# Patient Record
Sex: Male | Born: 1985 | Race: Black or African American | Hispanic: No | Marital: Single | Smoking: Never smoker
Health system: Southern US, Community
[De-identification: ages and names within clinical notes are randomized; demographics above are authoritative.]

## PROBLEM LIST (undated history)

## (undated) DIAGNOSIS — E669 Obesity, unspecified: Secondary | ICD-10-CM

## (undated) HISTORY — DX: Obesity, unspecified: E66.9

---
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
## 2014-10-06 ENCOUNTER — Emergency Department (HOSPITAL_COMMUNITY): Payer: No Typology Code available for payment source

## 2014-10-06 ENCOUNTER — Encounter (HOSPITAL_COMMUNITY): Payer: Self-pay | Admitting: *Deleted

## 2014-10-06 ENCOUNTER — Emergency Department (HOSPITAL_COMMUNITY)
Admission: EM | Admit: 2014-10-06 | Discharge: 2014-10-06 | Disposition: A | Discharge status: Home / Self Care | Payer: No Typology Code available for payment source | Attending: Emergency Medicine | Admitting: Emergency Medicine

## 2014-10-06 DIAGNOSIS — S39012A Strain of muscle, fascia and tendon of lower back, initial encounter: Secondary | ICD-10-CM | POA: Diagnosis not present

## 2014-10-06 DIAGNOSIS — Y92410 Unspecified street and highway as the place of occurrence of the external cause: Secondary | ICD-10-CM | POA: Insufficient documentation

## 2014-10-06 DIAGNOSIS — Y9389 Activity, other specified: Secondary | ICD-10-CM | POA: Insufficient documentation

## 2014-10-06 DIAGNOSIS — S161XXA Strain of muscle, fascia and tendon at neck level, initial encounter: Secondary | ICD-10-CM | POA: Diagnosis not present

## 2014-10-06 DIAGNOSIS — S3992XA Unspecified injury of lower back, initial encounter: Secondary | ICD-10-CM | POA: Diagnosis present

## 2014-10-06 DIAGNOSIS — Y998 Other external cause status: Secondary | ICD-10-CM | POA: Insufficient documentation

## 2014-10-06 MED ORDER — NAPROXEN 500 MG PO TABS: 500.0000 mg | ORAL_TABLET | Freq: Two times a day (BID) | ORAL | Status: DC

## 2014-10-06 MED ORDER — HYDROCODONE-ACETAMINOPHEN 5-325 MG PO TABS: 2.0000 | ORAL_TABLET | ORAL | Status: DC | PRN

## 2014-10-06 MED ORDER — TRAMADOL HCL 50 MG PO TABS
50.0000 mg | ORAL_TABLET | Freq: Once | ORAL | Status: AC
  Administered 2014-10-06: 50 mg via ORAL
  Filled 2014-10-06: qty 1

## 2014-10-06 MED ORDER — METHOCARBAMOL 500 MG PO TABS: 500.0000 mg | ORAL_TABLET | Freq: Two times a day (BID) | ORAL | Status: DC | PRN

## 2014-10-06 NOTE — ED Notes (Addendum)
Pt ambulated to restroom with no difficulty. Given crackers and drink.
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
## 2014-10-06 NOTE — Discharge Instructions (Signed)
Please call your doctor for a followup appointment within 24-48 hours. When you talk to your doctor please let them know that you were seen in the emergency department and have them acquire all of your records so that they can discuss the findings with you and formulate a treatment plan to fully care for your new and ongoing problems. ° °RESOURCE GUIDE ° °Chronic Pain Problems: °Contact [facility]  [phone] °Patients need to be referred by their primary care doctor. ° °Insufficient Money for Medicine: °Contact [employer] Way:  call "211."  ° °No Primary Care Doctor: °- Call [facility]  [phone] - can help you locate a primary care doctor that  accepts your insurance, provides certain services, etc. °- Physician Referral Service- [phone] ° °Agencies that provide inexpensive medical care: °- [facility]  [phone] °- [facility]  [phone] °- [facility]  [phone] °- [facility]  [phone] °- [facility]  [phone] °- [facility]  [phone] ° °Medicaid-accepting [place] Providers: °- [facility]- [address] ° [phone], Mon-Fri 9am-7pm, Sat 9am-1pm °- [facility]- [address], [address] ° [phone] °- [facility]- [address], [address] ° [phone] °- [facility]- [address] [place] [address] ° [phone] °- Ve[facility]- [address], [address], [phone] ° Only accepts [place] Access Medicaid patients after they have their name  applied to their card ° °Self Pay (no insurance) in [place]: °- Sickle Cell Patients: Dr Eric Dean, [facility] ° [address], [phone] °- [facility]- [address] ° [phone] °      -      [facility]- [address] °      -     [facility]- see information above (Speak to Pam H if you do not have  insurance) °      -  [facility] [place]- [address],  [phone] °      -  [facility]- [address] [place] [address], [phone] °      -  Dr Osei-Bonsu-  [address], [address], [place], [phone] °      -  [facility] - [address], [phone] °      -  [facility]- [address] [place] [address], [phone], also [address], [phone] °      -    [facility]- [address], [phone], 1st & 3rd Saturday °       every month, 10am-1pm ° °[facility] °[address] °[place], [place] [address] °[phone] ° °[facility] °[address] °[place], [place] [address] °[phone] ° °1) Find a Doctor and Pay Out of Pocket °Although you won't have to find out who is covered by your insurance plan, it is a good idea to ask around and get recommendations. You will then need to call the office and see if the doctor you have chosen will accept you as a new patient and what types of options they offer for patients who are self-pay. Some doctors offer discounts or will set up payment plans for their patients who do not have insurance, but you will need to ask so you aren't surprised when you   get to your appointment.  2) Contact Your Local Health Department Not all health departments have doctors that can see patients for sick visits, but many do, so it is worth a call to see if yours does. If you don't know where your local health department is, you can check in your phone book. The CDC also has a tool to help you locate your state's health department, and many state websites also have listings of all of their local health departments.  3) Find a Walk-in Clinic If your illness is not likely to be very severe or complicated, you may want to try a walk in clinic. These are popping up all over the country in pharmacies, drugstores, and shopping centers. They're usually staffed by nurse practitioners or physician  assistants that have been trained to treat common illnesses and complaints. They're usually fairly quick and inexpensive. However, if you have serious medical issues or chronic medical problems, these are probably not your best option  STD Testing - [facility], [facility] Clinic, [address], [place], phone [phone] or [phone].  Monday - Friday, call for an appointment. [facility]- [place] [facility] [place], STD Clinic, [place], [place], phone [phone] or [phone].  Monday - Friday, call for an appointment.  Abuse/Neglect: [facility]- [place] Child Abuse Hotline 3080498219([phone]) [phone] [facility]- [place] Child Abuse Hotline [phone] (After Hours)  Emergency Shelter:  [facility] [phone]) [phone]  Maternity Homes: - [facility] [phone]) [phone] - [facility] [phone]  MRSA Hotline #:   714-510-0804236-083-2762  Dental Assistance If unable to pay or uninsured, contact:  [facility] to become qualified for the adult dental clinic.  Patients with Medicaid: [facility] [phone], [phone] [address], [phone]  If unable to pay, or uninsured, contact [facility] [phone] in [place], [phone] in [facility]) to become qualified for the adult dental clinic  [facility] [address] [place], [place] [phone] [email or web address]  Other [facility] Services: - [facility]- [address], [place], [place], [phone], [phone], [phone], 2nd and 4th Thursday of the month at 6:30am.  10 clients each day by appointment, can sometimes see walk-in patients if someone does not show for an appointment. [facility]- [address], [place], [place], [address], [phone] - [facility]- [address], [place], [place],  [phone], [phone] [facility]- [place] [facility]- [phone] [facility]- [facility]- [phone] [facility]- [phone] - Thought he listed in the right
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
## 2017-05-17 ENCOUNTER — Emergency Department (HOSPITAL_COMMUNITY): Payer: BLUE CROSS/BLUE SHIELD

## 2017-05-17 ENCOUNTER — Ambulatory Visit (INDEPENDENT_AMBULATORY_CARE_PROVIDER_SITE_OTHER): Payer: BLUE CROSS/BLUE SHIELD | Admitting: Medical

## 2017-05-17 ENCOUNTER — Encounter: Payer: Self-pay | Admitting: Medical

## 2017-05-17 ENCOUNTER — Emergency Department (HOSPITAL_COMMUNITY)
Admission: EM | Admit: 2017-05-17 | Discharge: 2017-05-18 | Disposition: A | Discharge status: Home / Self Care | Payer: BLUE CROSS/BLUE SHIELD | Attending: Emergency Medicine | Admitting: Emergency Medicine

## 2017-05-17 ENCOUNTER — Encounter (HOSPITAL_COMMUNITY): Payer: Self-pay | Admitting: Emergency Medicine

## 2017-05-17 VITALS — BP 128/70 | HR 118 | Temp 100.1°F | Ht 69.0 in | Wt 259.4 lb

## 2017-05-17 DIAGNOSIS — K610 Anal abscess: Secondary | ICD-10-CM | POA: Diagnosis not present

## 2017-05-17 DIAGNOSIS — K6289 Other specified diseases of anus and rectum: Secondary | ICD-10-CM | POA: Diagnosis not present

## 2017-05-17 DIAGNOSIS — R509 Fever, unspecified: Secondary | ICD-10-CM

## 2017-05-17 DIAGNOSIS — R52 Pain, unspecified: Secondary | ICD-10-CM

## 2017-05-17 DIAGNOSIS — L0291 Cutaneous abscess, unspecified: Secondary | ICD-10-CM

## 2017-05-17 LAB — CBC WITH DIFFERENTIAL/PLATELET
Band Neutrophils: 0 %
Basophils Absolute: 0 10*3/uL (ref 0.0–0.1)
Basophils Relative: 0 %
Blasts: 0 %
Eosinophils Absolute: 0 10*3/uL (ref 0.0–0.7)
Eosinophils Relative: 0 %
HCT: 47.7 % (ref 39.0–52.0)
Hemoglobin: 16.2 g/dL (ref 13.0–17.0)
Lymphocytes Relative: 15 %
Lymphs Abs: 2.3 10*3/uL (ref 0.7–4.0)
MCH: 28.5 pg (ref 26.0–34.0)
MCHC: 34 g/dL (ref 30.0–36.0)
MCV: 83.8 fL (ref 78.0–100.0)
Metamyelocytes Relative: 0 %
Monocytes Absolute: 1.2 10*3/uL — ABNORMAL HIGH (ref 0.1–1.0)
Monocytes Relative: 8 %
Myelocytes: 0 %
Neutro Abs: 12 10*3/uL — ABNORMAL HIGH (ref 1.7–7.7)
Neutrophils Relative %: 77 %
Other: 0 %
Platelets: 271 10*3/uL (ref 150–400)
Promyelocytes Absolute: 0 %
RBC: 5.69 MIL/uL (ref 4.22–5.81)
RDW: 12.8 % (ref 11.5–15.5)
WBC: 15.5 10*3/uL — ABNORMAL HIGH (ref 4.0–10.5)
nRBC: 0 /100 WBC

## 2017-05-17 LAB — I-STAT CG4 LACTIC ACID, ED
Lactic Acid, Venous: 1.27 mmol/L (ref 0.5–1.9)
Lactic Acid, Venous: 2.02 mmol/L (ref 0.5–1.9)

## 2017-05-17 LAB — COMPREHENSIVE METABOLIC PANEL
ALT: 13 U/L — ABNORMAL LOW (ref 17–63)
AST: 17 U/L (ref 15–41)
Albumin: 4.2 g/dL (ref 3.5–5.0)
Alkaline Phosphatase: 77 U/L (ref 38–126)
Anion gap: 11 (ref 5–15)
BUN: 12 mg/dL (ref 6–20)
CO2: 27 mmol/L (ref 22–32)
Calcium: 9.6 mg/dL (ref 8.9–10.3)
Chloride: 101 mmol/L (ref 101–111)
Creatinine, Ser: 1.4 mg/dL — ABNORMAL HIGH (ref 0.61–1.24)
GFR calc Af Amer: >60 mL/min (ref >60)
GFR calc non Af Amer: >60 mL/min (ref >60)
Glucose, Bld: 116 mg/dL — ABNORMAL HIGH (ref 65–99)
Potassium: 4.3 mmol/L (ref 3.5–5.1)
Sodium: 139 mmol/L (ref 135–145)
Total Bilirubin: 0.9 mg/dL (ref 0.3–1.2)
Total Protein: 8.6 g/dL — ABNORMAL HIGH (ref 6.5–8.1)

## 2017-05-17 LAB — PROTIME-INR
INR: 1.1
Prothrombin Time: 14.3 seconds (ref 11.4–15.2)

## 2017-05-17 MED ORDER — SODIUM CHLORIDE 0.9 % IV BOLUS (SEPSIS)
1000.0000 mL | Freq: Once | INTRAVENOUS | Status: AC
  Administered 2017-05-17: 1000 mL via INTRAVENOUS

## 2017-05-17 MED ORDER — CLINDAMYCIN HCL 150 MG PO CAPS: 450.0000 mg | ORAL_CAPSULE | Freq: Three times a day (TID) | ORAL | 0 refills | Status: AC

## 2017-05-17 MED ORDER — CLINDAMYCIN HCL 300 MG PO CAPS
450.0000 mg | ORAL_CAPSULE | Freq: Once | ORAL | Status: AC
  Administered 2017-05-18: 450 mg via ORAL
  Filled 2017-05-17: qty 1

## 2017-05-17 MED ORDER — OXYCODONE-ACETAMINOPHEN 5-325 MG PO TABS: 1.0000 | ORAL_TABLET | ORAL | 0 refills | Status: DC | PRN

## 2017-05-17 MED ORDER — IOPAMIDOL (ISOVUE-300) INJECTION 61%
100.0000 mL | Freq: Once | INTRAVENOUS | Status: AC | PRN
  Administered 2017-05-17: 100 mL via INTRAVENOUS

## 2017-05-17 MED ORDER — IOPAMIDOL (ISOVUE-300) INJECTION 61%
INTRAVENOUS | Status: AC
  Filled 2017-05-17: qty 100

## 2017-05-17 NOTE — Progress Notes (Signed)
Subjective: Chief Complaint  Patient presents with  . New Patient (Initial Visit)    hemmroids fever and backaches.started last thursday    Here as a new patient.  Was seeing PCP on [employer] prior.    Last week later in the week started feeling pain around anus.  Started out as a light discomfort, but daily has gotten progressively worse.  Feels like there is a bump to the side of the anus.   Pain today is 9/10.  Having trouble just standing and sitting today.  Thinks there has been some bloody drainage.  Felt feverish over the weekend.  Has been drinking a lot of smoothies, eating more fiber to help.    Prior to last week doesn't get bowel issues.   No prior hemorrhoids, no prior blood in stool.  Rarely gets constipated.  Prior to last week was in normal state of health.  Single, no prior sexual activity ever.   No recent sick contacts.  No urinary issues.  No hx/o HIV, diabetes or immunocompromised state.    No other aggravating or relieving factors. No other complaint.  No past medical history on file. No current outpatient prescriptions on file prior to visit.   No current facility-administered medications on file prior to visit.    Review of Systems Constitutional: +fever, -chills, -sweats, -unexpected weight change,-fatigue ENT: -runny nose, -ear pain, -sore throat Cardiology:  -chest pain, -palpitations, -edema Respiratory: -cough, -shortness of breath, -wheezing Gastroenterology: -abdominal pain, -nausea, -vomiting, -diarrhea, -constipation , +fecal incontinence this morning Hematology: -bleeding or bruising problems Musculoskeletal: -arthralgias, -myalgias, -joint swelling, +back pain Ophthalmology: -vision changes Urology: -dysuria, -difficulty urinating, -hematuria, -urinary frequency, -urgency Neurology: -headache, -weakness, -tingling, -numbness     Objective: BP 128/70   Pulse (!) 118   Temp 100.1 F (37.8 C)   Ht 5\' 9"  (1.753 m)   Wt 259 lb 6.4 oz (117.7 kg)    SpO2 96%   BMI 38.31 kg/m   Gen: wd, wn, in some pain when walking, ill appearing Skin: warm, dry Lungs clear Heart rrr, normal s1, s2, no murmurs Rectal: right buttock just adjacent to anus with 4-5 cm diameter area of swelling, erythema, induration, quite tender, center opening with some moist pus drainage, anus appears normal.      Assessment: Encounter Diagnoses  Name Primary?  . Perianal abscess Yes  . Fever, unspecified fever cause      Plan: discussed findings on exam.  Given tachycardia, fever, exam findings, we called and general surgery can't work him in  Today.   discussed diagnosis and treatment recommendations with patient.  Advised he report to the emergency dept [facility] for further treatment and eval.    Andrew Valdez was seen today for new patient (initial visit).  Diagnoses and all orders for this visit:  Perianal abscess  Fever, unspecified fever cause

## 2017-05-17 NOTE — Discharge Instructions (Signed)
Please take your antibiotics to treat the abscess. These take sitz baths. Please use the pain medicines and with your symptoms. Please follow-up with your primary care physician for reevaluation and further management in the next few days. If any symptoms change or worsen, please return to the nearest emergency department.

## 2017-05-17 NOTE — ED Triage Notes (Signed)
[facility] perianal abscess. Per chart abscess is a surgical indication. Pt reports fever yet not febrile while triaging. Pain 9/10. Appears in distress.

## 2017-05-17 NOTE — Patient Instructions (Signed)
Perianal Abscess  An abscess is an infected area that is filled with pus. A perianal abscess occurs in the perineum, which is the area between the anus and the scrotum in males and between the anus and the vagina in females. Perianal abscesses can vary in size. Without treatment, a perianal abscess can become larger and cause other problems.  What are the causes?  This condition is caused by:  · Waste from damaged or dead tissue (debris) that plugs up glands in the perineum. When this happens, an abscess may form.  · Infections of the perineum.    What are the signs or symptoms?  Common symptoms of this condition include:  · Swelling and redness in the area of the abscess. The redness may go beyond the abscess and appear as a red streak on the skin.  · Pain in the area of the abscess, including pain when sitting, walking, or passing stool.    Other possible symptoms include:  · A visible, painful lump, or a lump that can be felt when touched.  · Bleeding or pus-like discharge from the area.  · Fever.  · General weakness.    How is this diagnosed?  This condition is diagnosed based on your medical history and a physical exam of the affected area.  · This may involve examining the rectal area with a gloved hand (digital rectal exam).  · Sometimes, the health care provider needs to look into the rectum using a probe or a scope.  · For women, it may require a careful vaginal exam.    How is this treated?  Treatment for this condition may include:  · Making a cut (incision) in the abscess to drain the pus. This can sometimes be done in your health care provider's office or an emergency department after you are given medicine to numb the area (local anesthetic).  · Surgery to drain the abscess. This is for larger or deeper abscesses.  · Antibiotic medicines, if there is infection in the surrounding tissue (cellulitis).  · Having gauze packed into the abscess to continue draining the area.  · Frequent baths in warm water  that is deep enough to cover your hips and buttocks (sitz baths). These help the wound heal and they make the abscess less likely to come back.    Follow these instructions at home:  Medicines  · Take over-the-counter and prescription medicines for pain, fever, or discomfort only as told by your health care provider.  · If you were prescribed an antibiotic medicine, use it as told by your health care provider. Do not stop using the antibiotic even if you start to feel better.  · Do not drive or use heavy machinery while taking prescription pain medicine.  Wound care    · Keep the skin around the wound clean and dry. Avoid cleaning the area too much.  · Avoid scratching the wound.  · Avoid using colored or perfumed toilet papers.  · Take a sitz bath 3-4 times a day and after bowel movements. This will help reduce pain and swelling.  · If directed, apply ice to the injured area:  ? Put ice in a plastic bag.  ? Place a towel between your skin and the bag.  ? Leave the ice on for 20 minutes, 2-3 times a day.  · Check your incision area every day for signs of infection. Check for:  ? More redness, swelling, or pain.  ? More fluid or blood.  ?   Warmth.  ? Pus or a bad smell.  Gauze  · If gauze was used in the abscess, follow instructions from your health care provider about removing or changing the gauze. It can usually be removed in 2-3 days.  · Wash your hands with soap and water before you remove or change your gauze. If soap and water are not available, use hand sanitizer.  · If one or more drains were placed in the abscess cavity, be careful not to pull at them. Your health care provider will tell you how long they need to remain in place.  General instructions  · Keep all follow-up visits as told by your health care provider. This is important.  Contact a health care provider if:  · You have trouble passing stool or passing urine.  · Your pain or swelling in the affected area does not seem to be getting  better.  · The gauze packing or the drains come out before the planned time.  Get help right away if:  · You have problems moving or using your legs.  · You have severe or increasing pain.  · Your swelling in the affected area suddenly gets worse.  · You have a large increase in bleeding or passing of pus.  · You have chills or a fever.  This information is not intended to replace advice given to you by your health care provider. Make sure you discuss any questions you have with your health care provider.  Document Released: 11/16/2006 Document Revised: 04/29/2016 Document Reviewed: 03/21/2016  Elsevier Interactive Patient Education © 2018 Elsevier Inc.

## 2017-05-17 NOTE — ED Notes (Signed)
Pt is alert and oriented x 4 and is verbally responsive. Pt is talking with the provider.

## 2017-05-17 NOTE — ED Notes (Signed)
Notified EDP,Tegler,MD., pt. I-stat CG4 Lactic acid results 2.02 and RN,Celeste.

## 2017-05-17 NOTE — ED Provider Notes (Signed)
[facility]-EMERGENCY DEPT Provider Note   CSN: [id number] Arrival date & time: 05/17/17  1533     History   Chief Complaint Chief Complaint  Patient presents with  . perianal abscess    HPI Andrew Valdez is a 31 y.o. male.  The history is provided by the patient and medical records.  Abscess  Location:  Pelvis Pelvic abscess location:  R buttock Size:  1 Abscess quality: draining, fluctuance, induration, painful, redness and warmth   Red streaking: no   Duration:  1 week Progression:  Unchanged Pain details:    Quality:  Aching   Severity:  Moderate   Timing:  Constant   Progression:  Unchanged Chronicity:  New Context: not diabetes and not skin injury   Relieved by:  Nothing Worsened by:  Nothing Ineffective treatments:  None tried Associated symptoms: no fatigue, no fever, no headaches, no nausea and no vomiting   Risk factors: no prior abscess     History reviewed. No pertinent past medical history.  There are no active problems to display for this patient.   History reviewed. No pertinent surgical history.     Home Medications    Prior to Admission medications   Medication Sig Start Date End Date Taking? Authorizing Provider  shark liver oil-cocoa butter (PREPARATION H) 0.25-3-85.5 % suppository Place 1 suppository rectally as needed for hemorrhoids.    [provider]    Family History No family history on file.  Social History Social History  Substance Use Topics  . Smoking status: Never Smoker  . Smokeless tobacco: Never Used  . Alcohol use No     Allergies   Patient has no known allergies.   Review of Systems Review of Systems  Constitutional: Negative for chills, diaphoresis, fatigue and fever.  HENT: Negative for congestion.   Respiratory: Negative for chest tightness, shortness of breath and wheezing.   Cardiovascular: Negative for chest pain and palpitations.  Gastrointestinal: Positive for diarrhea and rectal pain.  Negative for abdominal pain, blood in stool, constipation, nausea and vomiting.  Genitourinary: Negative for flank pain.  Musculoskeletal: Negative for back pain, neck pain and neck stiffness.  Skin: Positive for wound.  Neurological: Negative for light-headedness and headaches.  Psychiatric/Behavioral: Negative for agitation and confusion.  All other systems reviewed and are negative.    Physical Exam Updated Vital Signs BP 138/86 (BP Location: Left Arm)   Pulse (!) 102   Temp 98.9 F (37.2 C) (Oral)   Resp 16   Ht 5\' 10"  (1.778 m)   Wt 117.9 kg (260 lb)   SpO2 100%   BMI 37.31 kg/m   Physical Exam  Constitutional: He appears well-developed and well-nourished. No distress.  HENT:  Head: Normocephalic and atraumatic.  Nose: Nose normal.  Mouth/Throat: Oropharynx is clear and moist. No oropharyngeal exudate.  Eyes: Pupils are equal, round, and reactive to light. Conjunctivae are normal.  Neck: Normal range of motion. Neck supple.  Cardiovascular: Normal rate and regular rhythm.   No murmur heard. Pulmonary/Chest: Effort normal and breath sounds normal. No stridor. No respiratory distress. He exhibits no tenderness.  Abdominal: Soft. There is no tenderness.  Genitourinary: Rectal exam shows no external hemorrhoid and no internal hemorrhoid.     Musculoskeletal: He exhibits tenderness. He exhibits no edema.  Neurological: He is alert. No sensory deficit. He exhibits normal muscle tone.  Skin: Skin is warm and dry. Capillary refill takes less than 2 seconds. He is not diaphoretic. There is erythema. No pallor.  Psychiatric: He has a normal mood and affect.  Nursing note and vitals reviewed.    ED Treatments / Results  Labs (all labs ordered are listed, but only abnormal results are displayed) Labs Reviewed  COMPREHENSIVE METABOLIC PANEL - Abnormal; Notable for the following:       Result Value   Glucose, Bld 116 (*)    Creatinine, Ser 1.40 (*)    Total Protein 8.6  (*)    ALT 13 (*)    All other components within normal limits  CBC WITH DIFFERENTIAL/PLATELET - Abnormal; Notable for the following:    WBC 15.5 (*)    Neutro Abs 12.0 (*)    Monocytes Absolute 1.2 (*)    All other components within normal limits  URINALYSIS, ROUTINE W REFLEX MICROSCOPIC - Abnormal; Notable for the following:    Specific Gravity, Urine >1.046 (*)    Ketones, ur 20 (*)    All other components within normal limits  I-STAT CG4 LACTIC ACID, ED - Abnormal; Notable for the following:    Lactic Acid, Venous 2.02 (*)    All other components within normal limits  CULTURE, BLOOD (ROUTINE X 2)  CULTURE, BLOOD (ROUTINE X 2)  PROTIME-INR  I-STAT CG4 LACTIC ACID, ED    EKG  EKG Interpretation None       Radiology Ct Pelvis W Contrast  Result Date: 05/17/2017 CLINICAL DATA:  Perianal abscess EXAM: CT PELVIS WITH CONTRAST TECHNIQUE: Multidetector CT imaging of the pelvis was performed using the standard protocol following the bolus administration of intravenous contrast. CONTRAST:  100mL ISOVUE-300 IOPAMIDOL (ISOVUE-300) INJECTION 61% COMPARISON:  None. FINDINGS: Urinary Tract:  No abnormality visualized. Bowel: Unremarkable visualized pelvic bowel loops. Normal appendix. Soft tissue thickening and edema of the right perianal soft tissues. Tiny fluid and gas collection measuring 18 x 12 by 14 mm. Vascular/Lymphatic: No pathologically enlarged lymph nodes. No significant vascular abnormality seen. Reproductive:  Mild prostate calcification Other:  No significant free fluid Musculoskeletal: No suspicious bone lesions identified. IMPRESSION: Soft tissue thickening in the right perianal region. There is a small gas and fluid collection in the right perianal region measuring 18 mm in maximum dimension consistent with a small perianal abscess. Electronically Signed   By: Jasmine PangKim  Fujinaga M.D.   On: 05/17/2017 22:40    Procedures Procedures (including critical care time)  Medications  Ordered in ED Medications  iopamidol (ISOVUE-300) 61 % injection (not administered)  sodium chloride 0.9 % bolus 1,000 mL (0 mLs Intravenous Stopped 05/17/17 2345)  iopamidol (ISOVUE-300) 61 % injection 100 mL (100 mLs Intravenous Contrast Given 05/17/17 2226)  clindamycin (CLEOCIN) capsule 450 mg (450 mg Oral Given 05/18/17 0002)     Initial Impression / Assessment and Plan / ED Course  I have reviewed the triage vital signs and the nursing notes.  Pertinent labs & imaging results that were available during my care of the patient were reviewed by me and considered in my medical decision making (see chart for details).     Andrew Valdez is a 31 y.o. male with no significant past medical history who presents from urgent care for concern for perirectal abscess. Patient says that for the last week, he has had pain in his right but talked. He says it began to hurt worse over the last several days. He says it started draining pus. He reports associated diarrhea. He denies fevers, chills, nausea vomiting or other systemic signs of infection. He went to urgent care today where they assessed him  and thought he had an abscess needing further evaluation and management. Patient denies other complaints. He describes his pain as moderate.  On exam, patient has fluctuance, erythema, tenderness, in the right buttock. It is near the rectum. Rectal exam was performed with some tenderness. Next  Given the tenderness in the location, CT scan was ordered. CT scan showed a tiny abscess in the perianal region.  Given the lack of large abscess, the fact that it is draining on its own, and the patient's improvement in the ED, suspect patient is stable for antibiotics and outpatient management. Do not feel patient needs surgical evaluation and operative management at this time. Patient was offered incision and drainage however, given the fact that it is currently draining and the small size of the abscess, patient was  offered management with antibodics, sitz baths, and letting it drain on its own. Patient elected to take this option. Patient was instructed to take the antibiotic and pain medicine.   Patient given instructions to follow-up with PCP in the next several days as well as strict return precautions for any new or worsened symptoms are abscess. Patient was understanding of the plan of care and clearly understood return precautions. Patient had no depressions or concerns and was discharged in good condition.   Final Clinical Impressions(s) / ED Diagnoses   Final diagnoses:  Abscess    New Prescriptions New Prescriptions   CLINDAMYCIN (CLEOCIN) 150 MG CAPSULE    Take 3 capsules (450 mg total) by mouth 3 (three) times daily.   OXYCODONE-ACETAMINOPHEN (PERCOCET/ROXICET) 5-325 MG TABLET    Take 1 tablet by mouth every 4 (four) hours as needed for severe pain.   Clinical Impression: 1. Abscess   2. Pain     Disposition: Discharge  Condition: Good  I have discussed the results, Dx and Tx plan with the pt(& family if present). He/she/they expressed understanding and agree(s) with the plan. Discharge instructions discussed at great length. Strict return precautions discussed and pt &/or family have verbalized understanding of the instructions. No further questions at time of discharge.    New Prescriptions   CLINDAMYCIN (CLEOCIN) 150 MG CAPSULE    Take 3 capsules (450 mg total) by mouth 3 (three) times daily.   OXYCODONE-ACETAMINOPHEN (PERCOCET/ROXICET) 5-325 MG TABLET    Take 1 tablet by mouth every 4 (four) hours as needed for severe pain.    Follow Up: Genia Del [address] [place] [address] [phone]  Schedule an appointment as soon as possible for a visit    [facility] [address] [id number] mc [place] [address] [phone]  If symptoms worsen     Tegeler, Canary Brim, MD 05/18/17  1119

## 2017-05-18 LAB — URINALYSIS, ROUTINE W REFLEX MICROSCOPIC
Bilirubin Urine: NEGATIVE
Glucose, UA: NEGATIVE mg/dL
Hgb urine dipstick: NEGATIVE
Ketones, ur: 20 mg/dL — AB
Leukocytes, UA: NEGATIVE
Nitrite: NEGATIVE
Protein, ur: NEGATIVE mg/dL
Specific Gravity, Urine: >1.046 — ABNORMAL HIGH (ref 1.005–1.030)
pH: 5 (ref 5.0–8.0)

## 2017-05-23 LAB — CULTURE, BLOOD (ROUTINE X 2)
Culture: NO GROWTH
Culture: NO GROWTH
Special Requests: ADEQUATE
Special Requests: ADEQUATE

## 2017-05-24 HISTORY — PX: NO PAST SURGERIES: SHX2092

## 2017-06-08 ENCOUNTER — Encounter: Payer: Self-pay | Admitting: Medical

## 2017-06-08 ENCOUNTER — Ambulatory Visit (INDEPENDENT_AMBULATORY_CARE_PROVIDER_SITE_OTHER): Payer: BLUE CROSS/BLUE SHIELD | Admitting: Medical

## 2017-06-08 VITALS — BP 114/72 | HR 83 | Wt 264.0 lb

## 2017-06-08 DIAGNOSIS — R7301 Impaired fasting glucose: Secondary | ICD-10-CM

## 2017-06-08 DIAGNOSIS — K610 Anal abscess: Secondary | ICD-10-CM

## 2017-06-08 LAB — POCT GLYCOSYLATED HEMOGLOBIN (HGB A1C): Hemoglobin A1C: 5.5

## 2017-06-08 LAB — GLUCOSE, POCT (MANUAL RESULT ENTRY): POC Glucose: 84 mg/dl (ref 70–99)

## 2017-06-08 MED ORDER — CLINDAMYCIN HCL 300 MG PO CAPS: 300.0000 mg | ORAL_CAPSULE | Freq: Three times a day (TID) | ORAL | 0 refills | Status: DC

## 2017-06-08 NOTE — Progress Notes (Signed)
Subjective: Chief Complaint  Patient presents with  . Follow-up    follow up from an abcess    Here for f/u on perianal abscess.  I saw him a few weeks ago as a new patient for perianal abscess.  We went him to the ED.  After CT scan, they decided to use antibiotic therapy and not I&D.   Since then he has had waxing and waning success.   In initially with antibiotics it felt as though it had fully drainage, then it started to form again, then drained, and then started to form again.  Still not 100% resolved.  No pain today though.     He wants help with weight loss.   He has struggled with obesity for years.   He had recent labs at the ED showing elevated sugar.   He denies polyuria, polydipsia, blurred vision.  No prior weight loss medication.  Exercise - cardio and weights, 4 times per week, usually 40 min cardio, 30 min weight. Diet - mostly meat.  Not a lot of vegetables, trying to cut back on carb's, sugar.  Drinks a lot of water and tea.    No other aggravating or relieving factors. No other complaint.  Past Medical History:  Diagnosis Date  . Obesity    No current outpatient prescriptions on file prior to visit.   No current facility-administered medications on file prior to visit.    Review of Systems Constitutional: -fever, -chills, -sweats, -unexpected weight change,-fatigue ENT: -runny nose, -ear pain, -sore throat Cardiology:  -chest pain, -palpitations, -edema Respiratory: -cough, -shortness of breath, -wheezing Gastroenterology: -abdominal pain, -nausea, -vomiting, -diarrhea, -constipation , -fecal incontinence this morning Hematology: -bleeding or bruising problems Musculoskeletal: -arthralgias, -myalgias, -joint swelling, -back pain Ophthalmology: -vision changes Urology: -dysuria, -difficulty urinating, -hematuria, -urinary frequency, -urgency Neurology: -headache, -weakness, -tingling, -numbness     Objective: BP 114/72   Pulse 83   Wt 264 lb (119.7 kg)   SpO2  97%   BMI 37.88 kg/m   Gen: wd, wn, in some pain when walking Skin: warm, dry Lungs clear Heart rrr, normal s1, s2, no murmurs Rectal: right buttock just adjacent to anus small area of fullness, but no erythema, no warmth, no fluctuance, non tender. Anus appears normal.      Assessment: Encounter Diagnoses  Name Primary?  . Impaired fasting blood sugar Yes  . Morbid obesity (HCC)   . Perianal abscess      Plan: discussed findings on exam today, reviewed ED report and labs from 05/17/17.  Spent a lot of time counseling on diet, exercise, setting weight loss goals, using smart phone app such as My Fitness Pal or [employer] to help monitor calories.  advised he shoot for 1500 cal daily.   Consider weight loss medication.  F/u in a month or 2 if not making some headway in this regard.  Perianal abscess - looks ok today.  Seems to be resolved.   advised if any recurrence, then do another round of Clindamycin sent to pharmacy.   Recheck if recurrence as well.    Cindra Presumeorrence was seen today for follow-up.  Diagnoses and all orders for this visit:  Impaired fasting blood sugar  Morbid obesity (HCC) -     Glucose (CBG) -     HgB A1c  Perianal abscess  Other orders -     clindamycin (CLEOCIN) 300 MG capsule; Take 1 capsule (300 mg total) by mouth 3 (three) times daily.

## 2017-11-01 ENCOUNTER — Encounter: Payer: Self-pay | Admitting: Medical

## 2017-11-01 ENCOUNTER — Ambulatory Visit: Payer: BLUE CROSS/BLUE SHIELD | Admitting: Medical

## 2017-11-01 VITALS — BP 128/80 | HR 68 | Wt 228.6 lb

## 2017-11-01 DIAGNOSIS — R899 Unspecified abnormal finding in specimens from other organs, systems and tissues: Secondary | ICD-10-CM | POA: Diagnosis not present

## 2017-11-01 DIAGNOSIS — M545 Low back pain, unspecified: Secondary | ICD-10-CM

## 2017-11-01 LAB — COMPREHENSIVE METABOLIC PANEL
AG Ratio: 1.4 (calc) (ref 1.0–2.5)
ALT: 5 U/L — ABNORMAL LOW (ref 9–46)
AST: 13 U/L (ref 10–40)
Albumin: 4.2 g/dL (ref 3.6–5.1)
Alkaline phosphatase (APISO): 71 U/L (ref 40–115)
BUN: 12 mg/dL (ref 7–25)
CO2: 28 mmol/L (ref 20–32)
Calcium: 9.8 mg/dL (ref 8.6–10.3)
Chloride: 100 mmol/L (ref 98–110)
Creat: 1 mg/dL (ref 0.60–1.35)
Globulin: 3.1 g/dL (calc) (ref 1.9–3.7)
Glucose, Bld: 77 mg/dL (ref 65–99)
Potassium: 4.3 mmol/L (ref 3.5–5.3)
Sodium: 138 mmol/L (ref 135–146)
Total Bilirubin: 0.4 mg/dL (ref 0.2–1.2)
Total Protein: 7.3 g/dL (ref 6.1–8.1)

## 2017-11-01 LAB — CBC
HCT: 45 % (ref 38.5–50.0)
Hemoglobin: 14.7 g/dL (ref 13.2–17.1)
MCH: 27.3 pg (ref 27.0–33.0)
MCHC: 32.7 g/dL (ref 32.0–36.0)
MCV: 83.6 fL (ref 80.0–100.0)
MPV: 9.5 fL (ref 7.5–12.5)
Platelets: 252 10*3/uL (ref 140–400)
RBC: 5.38 10*6/uL (ref 4.20–5.80)
RDW: 13.4 % (ref 11.0–15.0)
WBC: 4.4 10*3/uL (ref 3.8–10.8)

## 2017-11-01 NOTE — Progress Notes (Signed)
Subjective: Chief Complaint  Patient presents with  . Back Pain    back pain x2 days  no injury    Here for back pain.   Hurst in right low back.  Pain is intermittent the last 2 days, worse last night.  Hurts with bending and turning.  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  Denies sspecificinjury, fall, or trauma.  Did some ppull upsthis past weekend but doesn't think this caused a problem.  No pain in arms or legs.   No neck pain.   No numbness, no tingling, no weakness, no incontinence.   No blood in urine or stool.  No urinary c/o.   No consistent exercise.  No other aggravating or relieving factors. No other complaint.  Past Medical History:  Diagnosis Date  . Obesity    No current outpatient medications on file prior to visit.   No current facility-administered medications on file prior to visit.    ROS as in subjective   Objective: BP 128/80   Pulse 68   Wt 228 lb 9.6 oz (103.7 kg)   SpO2 98%   BMI 32.80 kg/m   Wt Readings from Last 3 Encounters:  11/01/17 228 lb 9.6 oz (103.7 kg)  06/08/17 264 lb (119.7 kg)  05/17/17 260 lb (117.9 kg)   BP Readings from Last 3 Encounters:  11/01/17 128/80  06/08/17 114/72  05/18/17 125/76   General appearance: alert, no distress, WD/WN, obese AA male Abdomen: +bs, soft, non tender, non distended, no masses, no hepatomegaly, no splenomegaly Back: non tender, normal ROM without pain Musculoskeletal: nontender, no swelling, no obvious deformity Extremities: no edema, no cyanosis, no clubbing Pulses: 2+ symmetric, upper and lower extremities, normal cap refill Neurological: alert, oriented x 3, CN2-12 intact, strength normal upper extremities and lower extremities, sensation normal throughout, DTRs 2+ throughout, no cerebellar signs, gait normal Psychiatric: normal affect, behavior normal, pleasant     Assessment: Encounter Diagnoses  Name Primary?  . Acute  right-sided low back pain without sciatica Yes  . Abnormal laboratory test      Plan: We discussed his symptoms which are nonspecific, and there is no obvious cause.  We discussed the wide differential.  He has lost 30 pounds since his last visit intentionally, was doing some recent pull-ups which he does not normally do, and his mattress is older.  He also had abnormal labs at the hospital this past summer.  We will check some labs today.  We talked about evaluate and see if he needs a new mattress, discussed stretching and exercise.  Andrew Presumeorrence was seen today for back pain.  Diagnoses and all orders for this visit:  Acute right-sided low back pain without sciatica -     CBC -     Comprehensive metabolic panel  Abnormal laboratory test -     CBC -     Comprehensive metabolic panel

## 2017-11-02 ENCOUNTER — Other Ambulatory Visit: Payer: Self-pay | Admitting: Medical

## 2017-11-02 MED ORDER — CYCLOBENZAPRINE HCL 10 MG PO TABS: ORAL_TABLET | ORAL | 0 refills | Status: DC

## 2018-06-06 ENCOUNTER — Encounter: Payer: Self-pay | Admitting: Medical

## 2018-06-06 ENCOUNTER — Ambulatory Visit: Payer: BLUE CROSS/BLUE SHIELD | Admitting: Medical

## 2018-06-06 VITALS — BP 122/80 | HR 88 | Temp 98.0°F | Ht 68.0 in | Wt 210.0 lb

## 2018-06-06 DIAGNOSIS — K611 Rectal abscess: Secondary | ICD-10-CM

## 2018-06-06 MED ORDER — AMOXICILLIN-POT CLAVULANATE 875-125 MG PO TABS: 1.0000 | ORAL_TABLET | Freq: Two times a day (BID) | ORAL | 0 refills | Status: DC

## 2018-06-06 MED ORDER — HYDROCODONE-ACETAMINOPHEN 7.5-325 MG PO TABS: 1.0000 | ORAL_TABLET | Freq: Four times a day (QID) | ORAL | 0 refills | Status: AC | PRN

## 2018-06-06 NOTE — Progress Notes (Signed)
Subjective: Chief Complaint  Patient presents with  . abcess    anal area    Here for possible recurrent perirectal abscess.   Has been seen twice prior for same in past year.  He notes that after his last visit here it never really completely cleared up.  He has felt a cystic nodular lesion in the same area for the past year.  Currently the area is swollen, red, painful.  Denies fever.  No diarrhea.  No history of diabetes or HIV although his sugar was a little borderline about a year ago.  He denies any prior sexual activity.  No other aggravating or relieving factors. No other complaint.   Past Medical History:  Diagnosis Date  . Obesity    No current outpatient medications on file prior to visit.   No current facility-administered medications on file prior to visit.    ROS as in subjective    Objective: BP 122/80   Pulse 88   Temp 98 F (36.7 C) (Oral)   Ht 5\' 8"  (1.727 m)   Wt 210 lb (95.3 kg)   SpO2 98%   BMI 31.93 kg/m   Wt Readings from Last 3 Encounters:  06/06/18 210 lb (95.3 kg)  11/01/17 228 lb 9.6 oz (103.7 kg)  06/08/17 264 lb (119.7 kg)   General: Well-developed well-nourished no acute distress, African-American male To the right of the anus is a indurated tender swollen and fluctuant erythematous mass approximately 5 cm diameter.  Anus otherwise normal-appearing    Assessment: Encounter Diagnosis  Name Primary?  . Perirectal abscess Yes    Plan: Discussed examination findings, diagnosis, usual course of illness, and options for therapy discussed.   Dr. Lynelle DoctorKnapp supervising physician also examined patient.  After discussing recommendations, patient agrees to I&D, oral antibiotics.    Procedure Informed consent obtained.  The area was prepped in the usual manner and the skin overlying the abscess was anesthetized with 4cc of 1% plain lidocaine.  The area was sharply incised and approx 8ccs of purulent material was expressed.  Area was irrigated with  high pressure saline. Packing was inserted.  Used absorbant pad tucked in to catch excess seepage.   Advised patient to complete the course of oral antibiotics, use warm compresses or heat applied to the area to promote drainage.  Follow up: 24 hours for packing removal.  However, if worse signs of infections as discussed (fever, chills, nausea, vomiting, worsening redness, worsening pain), then call or return immediately.  Cindra Presumeorrence was seen today for abcess.  Diagnoses and all orders for this visit:  Perirectal abscess -     WOUND CULTURE  Other orders -     amoxicillin-clavulanate (AUGMENTIN) 875-125 MG tablet; Take 1 tablet by mouth 2 (two) times daily. -     HYDROcodone-acetaminophen (NORCO) 7.5-325 MG tablet; Take 1 tablet by mouth every 6 (six) hours as needed for up to 5 days for moderate pain.

## 2018-06-07 ENCOUNTER — Encounter: Payer: Self-pay | Admitting: Medical

## 2018-06-07 ENCOUNTER — Ambulatory Visit (INDEPENDENT_AMBULATORY_CARE_PROVIDER_SITE_OTHER): Payer: BLUE CROSS/BLUE SHIELD | Admitting: Medical

## 2018-06-07 VITALS — BP 128/80 | HR 71 | Ht 68.0 in | Wt 210.0 lb

## 2018-06-07 DIAGNOSIS — K611 Rectal abscess: Secondary | ICD-10-CM

## 2018-06-07 NOTE — Progress Notes (Signed)
Subjective: Chief Complaint  Patient presents with  . packig removal   Here for packing removal from abscess drainage yesterday.  He already feels better, but has not started the antibiotic yet  Objective: BP 128/80   Pulse 71   Ht 5\' 8"  (1.727 m)   Wt 210 lb (95.3 kg)   SpO2 97%   BMI 31.93 kg/m   Right perirectal region not swollen indurated and inflamed as it was yesterday.  Remove packing without complication or pain  Assessment: Encounter Diagnosis  Name Primary?  . Perirectal abscess Yes     Plan: Began twice daily bath soaks with hot soapy water and Epson salt 20 minutes twice daily.  Begin antibiotic.  Pain medicine as needed.  Follow-up in 7 to 10 days for routine follow-up and recheck on abscess as well

## 2018-06-08 LAB — WOUND CULTURE: Organism ID, Bacteria: NONE SEEN

## 2018-10-24 HISTORY — PX: INCISION AND DRAINAGE PERIRECTAL ABSCESS: SHX1804

## 2022-08-30 ENCOUNTER — Ambulatory Visit: Payer: Self-pay | Admitting: General Surgery

## 2022-08-30 NOTE — H&P (Signed)
   REFERRING PHYSICIAN:  Minda Ditto, PA  PROVIDER:  Monico Blitz, MD  MRN: [id number] DOB: December 14, 1985 DATE OF ENCOUNTER: 08/30/2022  Subjective   Chief Complaint: Fistula     History of Present Illness: Andrew Valdez is a 36 y.o. male who is seen today as an office consultation at the request of Dr. Leonie Green for evaluation of Fistula .  Patient seen at urgent care for thrombosed hemorrhoid in June 2023.  He underwent I&D of thrombosed hemorrhoid but was noted to have an area of drainage.  Patient states that he had a perianal abscess several years ago that got drained and seem to resolve but he continues to have episodes of swelling and pain as well as drainage after bowel movements.  Patient denies any chronic diarrhea.  He does have some difficulty with constipation and external hemorrhoids.    Review of Systems: A complete review of systems was obtained from the patient.  I have reviewed this information and discussed as appropriate with the patient.  See HPI as well for other ROS.     Medical History: History reviewed. No pertinent past medical history.  There is no problem list on file for this patient.   History reviewed. No pertinent surgical history.   No Known Allergies  No current outpatient medications on file prior to visit.   No current facility-administered medications on file prior to visit.    Family History  Problem Relation Age of Onset   Breast cancer Mother    High blood pressure (Hypertension) Father      Social History   Tobacco Use  Smoking Status Never  Smokeless Tobacco Never     Social History   Socioeconomic History   Marital status: Single  Tobacco Use   Smoking status: Never   Smokeless tobacco: Never  Vaping Use   Vaping Use: Never used  Substance and Sexual Activity   Alcohol use: Defer   Drug use: Defer    Objective:    Vitals:   08/30/22 1029  BP: 122/82  Pulse: 81  Temp: 36.6 C (97.9 F)   SpO2: 98%  Weight: (!) 117 kg (258 lb)  Height: 172.7 cm (5\' 8" )     Exam Gen: NAD Abd: soft CV: RRR Lungs: CTA Rectal: Anterior midline external opening.  Probe inserted at least 2 cm   Assessment and Plan:  Diagnoses and all orders for this visit:  Anal fistula  36 year old male who has a history of a perirectal abscess.  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  We discussed treatment of fistula in detail including fistulotomy and seton placement depending on the amount of muscle involved with the fistula tract.  We discussed performing an exam under anesthesia and deciding intraoperatively which procedure we would do.  We discussed a 2% recurrence rate and approximately 2 to 3-week healing time with fistulotomy.  We discussed seton placement and the need for additional surgery if this is done.  All questions were answered.  Patient would like to proceed with surgery.    No follow-ups on file.    Rosario Adie, MD Colon and Rectal Surgery [facility]

## 2022-11-16 ENCOUNTER — Telehealth: Payer: BLUE CROSS/BLUE SHIELD | Admitting: Medical

## 2022-11-17 ENCOUNTER — Telehealth (INDEPENDENT_AMBULATORY_CARE_PROVIDER_SITE_OTHER): Payer: BC Managed Care – PPO | Admitting: Medical

## 2022-11-17 ENCOUNTER — Encounter: Payer: Self-pay | Admitting: Medical

## 2022-11-17 VITALS — Ht 69.0 in | Wt 260.0 lb

## 2022-11-17 DIAGNOSIS — J069 Acute upper respiratory infection, unspecified: Secondary | ICD-10-CM | POA: Diagnosis not present

## 2022-11-17 DIAGNOSIS — J029 Acute pharyngitis, unspecified: Secondary | ICD-10-CM

## 2022-11-17 NOTE — Progress Notes (Signed)
Subjective:     Patient ID: Andrew Valdez, male   DOB: 12-29-1985, 37 y.o.   MRN: [id number]  This visit type was conducted due to national recommendations for restrictions regarding the COVID-19 Pandemic (e.g. social distancing) in an effort to limit this patient's exposure and mitigate transmission in our community.  Due to their co-morbid illnesses, this patient is at least at moderate risk for complications without adequate follow up.  This format is felt to be most appropriate for this patient at this time.    Documentation for virtual audio and video telecommunications through [facility] encounter:  The patient was located at home. The provider was located in the office. The patient did consent to this visit and is aware of possible charges through their insurance for this visit.  The other persons participating in this telemedicine service were none. Time spent on call was 20 minutes and in review of previous records 20 minutes total.  This virtual service is not related to other E/M service within previous 7 days.   HPI Chief Complaint  Patient presents with   other    ST, had a cold not to long ago, soreness in back of the throat, started last week, no fever,    Virtual consult for 2 week of symptoms.   Has had a week of runny nose, cough, sore throat, soreness in throat.  Most of those symptoms improved, but still having sore throat persistent the past week.  No fever, no body aches or chills.  No ear pain, no sinus pressure.  Maybe some mucous draining in back of throat.  Not so much nasal mucous.   No chest congestion.   Using theraflu, drinking echinacea tea.   No salt water gargles.  No other aggravating or relieving factors. No other complaint.   Past Medical History:  Diagnosis Date   Obesity    No current outpatient medications on file prior to visit.   No current facility-administered medications on file prior to visit.    Review of Systems As in  subjective    Objective:   Physical Exam Due to coronavirus pandemic stay at home measures, patient visit was virtual and they were not examined in person.   Ht 5\' 9"  (1.753 m)   Wt 260 lb (117.9 kg)   BMI 38.40 kg/m   Gen: wd,wn, nad No labored breathing or wheezing No hoarse throat     Assessment:     Encounter Diagnoses  Name Primary?   Sore throat Yes   Upper respiratory tract infection, unspecified type        Plan:     Symptoms and exams just resolving respiratory tract infection URI with some residual postnasal drainage and irritated throat.  We discussed the following recommendations.  Patient Instructions  Your symptoms suggest some residual drainage from having a recent cold or virus  Recommendations: Drink plenty of water daily such as 80 to 100 ounces of water daily Use salt water gargles several times a day to clear out mucus from the back of the throat and to soothe the throat Use hot fluids such as hot tea or coffee or honey tea and lemon mixture to soothe the throat You can use over-the-counter ibuprofen 200 mg, 3 tablets twice a day for the next few days for throat pain and inflammation in the back of her throat If desired you can use Chloraseptic spray or Cepacol lozenges to soothe throat pain Consider either over-the-counter antihistamine such as Claritin, Zyrtec, Benadryl  or other to help clear out drainage Or consider over-the-counter decongestant such as Tylenol Sinus or Mucinex max for example to clear up the drainage If worse pain in the throat, fever, worse redness in the throat over the next 2 days and call back The symptoms should resolve over the next 4 to 5 days  Andrew Valdez was seen today for other.  Diagnoses and all orders for this visit:  Sore throat  Upper respiratory tract infection, unspecified type  F/u prn

## 2022-11-17 NOTE — Patient Instructions (Signed)
Your symptoms suggest some residual drainage from having a recent cold or virus  Recommendations: Drink plenty of water daily such as 80 to 100 ounces of water daily Use salt water gargles several times a day to clear out mucus from the back of the throat and to soothe the throat Use hot fluids such as hot tea or coffee or honey tea and lemon mixture to soothe the throat You can use over-the-counter ibuprofen 200 mg, 3 tablets twice a day for the next few days for throat pain and inflammation in the back of her throat If desired you can use Chloraseptic spray or Cepacol lozenges to soothe throat pain Consider either over-the-counter antihistamine such as Claritin, Zyrtec, Benadryl or other to help clear out drainage Or consider over-the-counter decongestant such as Tylenol Sinus or Mucinex max for example to clear up the drainage If worse pain in the throat, fever, worse redness in the throat over the next 2 days and call back The symptoms should resolve over the next 4 to 5 days

## 2023-08-15 ENCOUNTER — Ambulatory Visit (INDEPENDENT_AMBULATORY_CARE_PROVIDER_SITE_OTHER): Payer: BC Managed Care – PPO | Admitting: Medical

## 2023-08-15 ENCOUNTER — Encounter: Payer: Self-pay | Admitting: Medical

## 2023-08-15 VITALS — BP 126/80 | HR 90 | Ht 71.0 in | Wt 268.0 lb

## 2023-08-15 DIAGNOSIS — Z13220 Encounter for screening for lipoid disorders: Secondary | ICD-10-CM | POA: Diagnosis not present

## 2023-08-15 DIAGNOSIS — Z23 Encounter for immunization: Secondary | ICD-10-CM | POA: Diagnosis not present

## 2023-08-15 DIAGNOSIS — Z113 Encounter for screening for infections with a predominantly sexual mode of transmission: Secondary | ICD-10-CM | POA: Diagnosis not present

## 2023-08-15 DIAGNOSIS — R0789 Other chest pain: Secondary | ICD-10-CM

## 2023-08-15 DIAGNOSIS — Z0000 Encounter for general adult medical examination without abnormal findings: Secondary | ICD-10-CM | POA: Diagnosis not present

## 2023-08-15 DIAGNOSIS — Z7185 Encounter for immunization safety counseling: Secondary | ICD-10-CM

## 2023-08-15 DIAGNOSIS — Z131 Encounter for screening for diabetes mellitus: Secondary | ICD-10-CM | POA: Insufficient documentation

## 2023-08-15 LAB — POCT URINALYSIS DIP (PROADVANTAGE DEVICE)
Bilirubin, UA: NEGATIVE
Blood, UA: NEGATIVE
Glucose, UA: NEGATIVE mg/dL
Ketones, POC UA: NEGATIVE mg/dL
Leukocytes, UA: NEGATIVE
Nitrite, UA: NEGATIVE
Protein Ur, POC: NEGATIVE mg/dL
Specific Gravity, Urine: 1.025
Urobilinogen, Ur: 0.2
pH, UA: 6 (ref 5.0–8.0)

## 2023-08-15 NOTE — Progress Notes (Signed)
Subjective:   HPI  Andrew Valdez is a 37 y.o. male who presents for Chief Complaint  Patient presents with   Annual Exam    Tdap. Felt a pressure near heart but changed his diet and still has residual feeling.     Patient Care Team: Tesean Stump, Kermit Balo, PA-C as PCP - General (Family Medicine)   Concerns: Has had some occasion chest discomfort, intermittent the last several months.   Worse months ago when he was not eating healthy  Below he still gets some chest discomfort not related to shortness of breath, no dizziness, no nausea vomiting, no abdominal pain   Reviewed their medical, surgical, family, social, medication, and allergy history and updated chart as appropriate.  Not on File  Past Medical History:  Diagnosis Date   Obesity     No current outpatient medications on file prior to visit.   No current facility-administered medications on file prior to visit.     No current outpatient medications on file.  Family History  Problem Relation Age of Onset   Cancer Mother        breast   Hypertension Father    Diabetes Neg Hx    Heart disease Neg Hx    Stroke Neg Hx     Past Surgical History:  Procedure Laterality Date   INCISION AND DRAINAGE PERIRECTAL ABSCESS  2020  Review of Systems  Constitutional:  Negative for chills, fever, malaise/fatigue and weight loss.  HENT:  Negative for congestion, ear pain, hearing loss, sore throat and tinnitus.   Eyes:  Negative for blurred vision, pain and redness.  Respiratory:  Negative for cough, hemoptysis and shortness of breath.   Cardiovascular:  Negative for chest pain, palpitations, orthopnea, claudication and leg swelling.  Gastrointestinal:  Negative for abdominal pain, blood in stool, constipation, diarrhea, nausea and vomiting.  Genitourinary:  Negative for dysuria, flank pain, frequency, hematuria and urgency.  Musculoskeletal:  Negative for falls, joint pain and myalgias.  Skin:  Negative for itching and  rash.  Neurological:  Negative for dizziness, tingling, speech change, weakness and headaches.  Endo/Heme/Allergies:  Negative for polydipsia. Does not bruise/bleed easily.  Psychiatric/Behavioral:  Negative for depression and memory loss. The patient is not nervous/anxious and does not have insomnia.       Objective:  BP 126/80   Pulse 90   Ht 5\' 11"  (1.803 m)   Wt 268 lb (121.6 kg)   SpO2 96%   BMI 37.38 kg/m   General appearance: alert, no distress, WD/WN, African American male Skin: unremarkable HEENT: normocephalic, conjunctiva/corneas normal, sclerae anicteric, PERRLA, EOMi, nares patent, no discharge or erythema, pharynx normal Oral cavity: MMM, tongue normal, teeth normal Neck: supple, no lymphadenopathy, no thyromegaly, no masses, normal ROM, no bruits Chest: non tender, normal shape and expansion Heart: RRR, normal S1, S2, no murmurs Lungs: CTA bilaterally, no wheezes, rhonchi, or rales Abdomen: +bs, soft, non tender, non distended, no masses, no hepatomegaly, no splenomegaly, no bruits Back: non tender, normal ROM, no scoliosis Musculoskeletal: upper extremities non tender, no obvious deformity, normal ROM throughout, lower extremities non tender, no obvious deformity, normal ROM throughout Extremities: no edema, no cyanosis, no clubbing Pulses: 2+ symmetric, upper and lower extremities, normal cap refill Neurological: alert, oriented x 3, CN2-12 intact, strength normal upper extremities and lower extremities, sensation normal throughout, DTRs 2+ throughout, no cerebellar signs, gait normal Psychiatric: normal affect, behavior normal, pleasant  GU: normal male external genitalia,circumcised, nontender, no masses, no hernia, no lymphadenopathy  Rectal: deferred    Assessment and Plan :   Encounter Diagnoses  Name Primary?   Encounter for health maintenance examination in adult Yes   Screen for STD (sexually transmitted disease)    Screening for lipid disorders     Screening for diabetes mellitus    Chest discomfort    Vaccine counseling    Need for Tdap vaccination     This visit was a preventative care visit, also known as wellness visit or routine physical.   Topics typically include healthy lifestyle, diet, exercise, preventative care, vaccinations, sick and well care, proper use of emergency dept and after hours care, as well as other concerns.     Separate significant issues discussed: Chest discomfort - etiology unclear, discussed differential.  Possible gerd related or stress related.  Doubt cardiac issue    General Recommendations: Continue to return yearly for your annual wellness and preventative care visits.  This gives [place] a chance to discuss healthy lifestyle, exercise, vaccinations, review your chart record, and perform screenings where appropriate.  I recommend you see your eye doctor yearly for routine vision care.  I recommend you see your dentist yearly for routine dental care including hygiene visits twice yearly.   Vaccination  Immunization History  Administered Date(s) Administered   Tdap 08/15/2023    Vaccine recommendations: Flu, tdap  Vaccines administered today: Counseled on the Tdap (tetanus, diptheria, and acellular pertussis) vaccine.  Vaccine information sheet given. Tdap vaccine given after consent obtained.   Screening for cancer: Colon cancer screening: Age 8  Testicular cancer screening You should do a monthly self testicular exam if you are between 73-49 years old, and we typically do a testicular exam on the yearly physical for this same age group.   Prostate Cancer screening: The recommended prostate cancer screening test is a blood test called the prostate-specific antigen (PSA) test. PSA is a protein that is made in the prostate. As you age, your prostate naturally produces more PSA. Abnormally high PSA levels may be caused by: Prostate cancer. An enlarged prostate that is not caused by cancer  (benign prostatic hyperplasia, or BPH). This condition is very common in older men. A prostate gland infection (prostatitis) or urinary tract infection. Certain medicines such as male hormones (like testosterone) or other medicines that raise testosterone levels. A rectal exam may be done as part of prostate cancer screening to help provide information about the size of your prostate gland. When a rectal exam is performed, it should be done after the PSA level is drawn to avoid any effect on the results.   Skin cancer screening: Check your skin regularly for new changes, growing lesions, or other lesions of concern Come in for evaluation if you have skin lesions of concern.   Lung cancer screening: If you have a greater than 20 pack year history of tobacco use, then you may qualify for lung cancer screening with a chest CT scan.   Please call your insurance company to inquire about coverage for this test.   Pancreatic cancer:  no current screening test is available or routinely recommended. (risk factors: smoking, overweight or obese, diabetes, chronic pancreatitis, work exposure - dry cleaning, metal working, 37yo>, M>F, Tree surgeon, family hx/o, hereditary breast, ovarian, melanoma, lynch, peutz-jeghers).  Symptoms: jaundice, dark urine, light color or greasy stools, itchy skin, belly or back pain, weight loss, poor appetite, nause, vomiting, liver enlargement, DVT/blood clots.   We currently don't have screenings for other cancers besides breast, cervical, colon,  and lung cancers.  If you have a strong family history of cancer or have other cancer screening concerns, please let me know.  Genetic testing referral is an option for individuals with high cancer risk in the family.  There are some other cancer screenings in development currently.   Bone health: Get at least 150 minutes of aerobic exercise weekly Get weight bearing exercise at least once weekly Bone density test:  A bone  density test is an imaging test that uses a type of X-ray to measure the amount of calcium and other minerals in your bones. The test may be used to diagnose or screen you for a condition that causes weak or thin bones (osteoporosis), predict your risk for a broken bone (fracture), or determine how well your osteoporosis treatment is working. The bone density test is recommended for females 65 and older, or females or males <65 if certain risk factors such as thyroid disease, long term use of steroids such as for asthma or rheumatological issues, vitamin D deficiency, estrogen deficiency, family history of osteoporosis, self or family history of fragility fracture in first degree relative.    Heart health: Get at least 150 minutes of aerobic exercise weekly Limit alcohol It is important to maintain a healthy blood pressure and healthy cholesterol numbers  Heart disease screening: Screening for heart disease includes screening for blood pressure, fasting lipids, glucose/diabetes screening, BMI height to weight ratio, reviewed of smoking status, physical activity, and diet.    Goals include blood pressure 120/80 or less, maintaining a healthy lipid/cholesterol profile, preventing diabetes or keeping diabetes numbers under good control, not smoking or using tobacco products, exercising most days per week or at least 150 minutes per week of exercise, and eating healthy variety of fruits and vegetables, healthy oils, and avoiding unhealthy food choices like fried food, fast food, high sugar and high cholesterol foods.    Other tests may possibly include EKG test, CT coronary calcium score, echocardiogram, exercise treadmill stress test.      Vascular disease screening: For higher risk individuals including smokers, diabetics, patients with known heart disease or high blood pressure, kidney disease, and others, screening for vascular disease or atherosclerosis of the arteries is available.  Examples  may include carotid ultrasound, abdominal aortic ultrasound, ABI blood flow screening in the legs, thoracic aorta screening.   Medical care options: I recommend you continue to seek care here first for routine care.  We try really hard to have available appointments Monday through Friday daytime hours for sick visits, acute visits, and physicals.  Urgent care should be used for after hours and weekends for significant issues that cannot wait till the next day.  The emergency department should be used for significant potentially life-threatening emergencies.  The emergency department is expensive, can often have long wait times for less significant concerns, so try to utilize primary care, urgent care, or telemedicine when possible to avoid unnecessary trips to the emergency department.  Virtual visits and telemedicine have been introduced since the pandemic started in 2020, and can be convenient ways to receive medical care.  We offer virtual appointments as well to assist you in a variety of options to seek medical care.   Legal  Take the time to do a last will and testament, Advanced Directives including Health Care Power of Attorney and Living Will documents.  Don't leave your family with burdens that can be handled ahead of time.   Advanced Directives: I recommend you consider completing a  Health Care Power of Attorney and Living Will.   These documents respect your wishes and help alleviate burdens on your loved ones if you were to become terminally ill or be in a position to need those documents enforced.    You can complete Advanced Directives yourself, have them notarized, then have copies made for our office, for you and for anybody you feel should have them in safe keeping.  Or, you can have an attorney prepare these documents.   If you haven't updated your Last Will and Testament in a while, it may be worthwhile having an attorney prepare these documents together and save on some costs.        Spiritual and Emotional Health Keeping a healthy spiritual life can help you better manage your physical health. Your spiritual life can help you to cope with any issues that may arise with your physical health.  Balance can keep [place] healthy and help [place] to recover.  If you are struggling with your spiritual health there are questions that you may want to ask yourself:  What makes me feel most complete? When do I feel most connected to the rest of the world? Where do I find the most inner strength? What am I doing when I feel whole?  Helpful tips: Being in nature. Some people feel very connected and at peace when they are walking outdoors or are outside. Helping others. Some feel the largest sense of wellbeing when they are of service to others. Being of service can take on many forms. It can be doing volunteer work, being kind to strangers, or offering a hand to a friend in need. Gratitude. Some people find they feel the most connected when they remain grateful. They may make lists of all the things they are grateful for or say a thank you out loud for all they have.    Emotional Health Are you in tune with your emotional health?  Check out this link: http://www.marquez-love.[email or web address]/    Financial Health Make sure you use a budget for your personal finances Make sure you are insured against risks (health insurance, life insurance, auto insurance, etc) Save more, spend less Set financial goals If you need help in this area, good resources include counseling through [employer] or other community resources, have a meeting with a Social research officer, government, and a good resource is the [employer] was seen today for annual exam.  Diagnoses and all orders for this visit:  Encounter for health maintenance examination in adult -     Comprehensive metabolic panel -     CBC with Differential/Platelet -     Lipid panel -     TSH -     Hemoglobin A1c -      POCT Urinalysis DIP (Proadvantage Device) -     Hepatitis C antibody -     Hepatitis B surface antigen -     HIV Antibody (routine testing w rflx) -     RPR -     Chlamydia/Gonococcus/Trichomonas, NAA -     EKG 12-Lead  Screen for STD (sexually transmitted disease) -     HIV Antibody (routine testing w rflx) -     RPR -     Chlamydia/Gonococcus/Trichomonas, NAA  Screening for lipid disorders -     Lipid panel  Screening for diabetes mellitus -     Hemoglobin A1c  Chest discomfort -     EKG 12-Lead  Vaccine counseling  Need for Tdap vaccination -     Tdap vaccine greater than or equal to 7yo IM    Follow-up pending labs, yearly for physical

## 2023-08-16 ENCOUNTER — Encounter: Payer: Self-pay | Admitting: Internal Medicine

## 2023-08-16 LAB — CBC WITH DIFFERENTIAL/PLATELET
Basophils Absolute: 0 10*3/uL (ref 0.0–0.2)
Basos: 1 %
EOS (ABSOLUTE): 0.1 10*3/uL (ref 0.0–0.4)
Eos: 1 %
Hematocrit: 48.5 % (ref 37.5–51.0)
Hemoglobin: 15.9 g/dL (ref 13.0–17.7)
Immature Grans (Abs): 0 10*3/uL (ref 0.0–0.1)
Immature Granulocytes: 0 %
Lymphocytes Absolute: 1.6 10*3/uL (ref 0.7–3.1)
Lymphs: 35 %
MCH: 28 pg (ref 26.6–33.0)
MCHC: 32.8 g/dL (ref 31.5–35.7)
MCV: 85 fL (ref 79–97)
Monocytes Absolute: 0.5 10*3/uL (ref 0.1–0.9)
Monocytes: 10 %
Neutrophils Absolute: 2.3 10*3/uL (ref 1.4–7.0)
Neutrophils: 53 %
Platelets: 261 10*3/uL (ref 150–450)
RBC: 5.68 x10E6/uL (ref 4.14–5.80)
RDW: 12.7 % (ref 11.6–15.4)
WBC: 4.5 10*3/uL (ref 3.4–10.8)

## 2023-08-16 LAB — COMPREHENSIVE METABOLIC PANEL
ALT: 12 [IU]/L (ref 0–44)
AST: 18 IU/L (ref 0–40)
Albumin: 4.1 g/dL (ref 4.1–5.1)
Alkaline Phosphatase: 70 IU/L (ref 44–121)
BUN/Creatinine Ratio: 15 (ref 9–20)
BUN: 16 mg/dL (ref 6–20)
Bilirubin Total: <0.2 mg/dL (ref 0.0–1.2)
CO2: 23 mmol/L (ref 20–29)
Calcium: 10.2 mg/dL (ref 8.7–10.2)
Chloride: 102 mmol/L (ref 96–106)
Creatinine, Ser: 1.06 mg/dL (ref 0.76–1.27)
Globulin, Total: 3.2 g/dL (ref 1.5–4.5)
Glucose: 90 mg/dL (ref 70–99)
Potassium: 4.5 mmol/L (ref 3.5–5.2)
Sodium: 141 mmol/L (ref 134–144)
Total Protein: 7.3 g/dL (ref 6.0–8.5)
eGFR: 93 mL/min/{1.73_m2} (ref >59)

## 2023-08-16 LAB — HEMOGLOBIN A1C
Est. average glucose Bld gHb Est-mCnc: 117 mg/dL
Hgb A1c MFr Bld: 5.7 % — ABNORMAL HIGH (ref 4.8–5.6)

## 2023-08-16 LAB — LIPID PANEL
Chol/HDL Ratio: 3.8 ratio (ref 0.0–5.0)
Cholesterol, Total: 191 mg/dL (ref 100–199)
HDL: 50 mg/dL (ref >39)
LDL Chol Calc (NIH): 125 mg/dL — ABNORMAL HIGH (ref 0–99)
Triglycerides: 88 mg/dL (ref 0–149)
VLDL Cholesterol Cal: 16 mg/dL (ref 5–40)

## 2023-08-16 LAB — RPR: RPR Ser Ql: NONREACTIVE

## 2023-08-16 LAB — HEPATITIS B SURFACE ANTIGEN: Hepatitis B Surface Ag: NEGATIVE

## 2023-08-16 LAB — HIV ANTIBODY (ROUTINE TESTING W REFLEX): HIV Screen 4th Generation wRfx: NONREACTIVE

## 2023-08-16 LAB — HEPATITIS C ANTIBODY: Hep C Virus Ab: NONREACTIVE

## 2023-08-16 LAB — TSH: TSH: 2.36 u[IU]/mL (ref 0.450–4.500)

## 2023-08-16 NOTE — Progress Notes (Signed)
Cholesterol a little high.  Diabetes marker at risk for diabetes.  Liver, kidney, blood counts, thyroid, electrolytes normal.  Hepatitis C negative, hepatitis B negative, HIV negative, syphilis negative.  Still pending gonorrhea and Chlamydia test.  I suspect the chest discomfort is either related to stress or indigestion or diet.  I recommend making significant changes with diet and exercise to lose weight.  I would certainly avoid foods that make indigestion worse such as citrus, peppers, spicy foods, hot sauce, big portions.  There are some medicines that could possibly help with weight loss as well.  You could check the insurance to see if they are covered.  Examples include Wegovy, Zepbound, Contrave, phentermine.   Prediabetes means you have a higher than normal blood sugar level. It's not high enough to be considered type 2 diabetes yet, but without making some lifestyle changes you are more likely to develop type 2 diabetes.  If you have prediabetes, the long-term damage of diabetes, especially to your heart, blood vessels and kidneys may already be starting. You may not be able to change certain risk factors such as age, race, or family history, but you CAN make changes to your lifestyle, your eating habits, and your activity.  Although diabetes can develop at any age, the risk of prediabetes increases after age 10.  Your risk of prediabetes increases if you have a parent or sibling with type 2 diabetes.   Although it's unclear why, certain people including Black, Hispanic, American [place] and [place] American people, are more likely to develop prediabetes.  Ways to prevent or slow progression to diabetes: Eat healthy foods - Eating red meat and processed meat, and drinking sugar-sweetened beverages, is associated with a higher risk of prediabetes. A diet high in fruits, vegetables, nuts, whole grains and olive oil is associated with a lower risk of prediabetes. Get at least 150 minutes of moderate  aerobic physical activity a week, or about 30 minutes on most days of the week.  The less active you are, the greater your risk of prediabetes. Physical activity helps you control your weight, uses up sugar for energy and makes the body use insulin more effectively. Lose excess weight - Being overweight is a primary risk factor for prediabetes. The more fatty tissue you have, especially inside and between the muscle and skin around your abdomen, the more resistant your cells become to insulin. Waist size. A large waist size can indicate insulin resistance. The risk of insulin resistance goes up for men with waists larger than 40 inches and for women with waists larger than 35 inches. Control your blood pressure and cholesterol.  If your blood pressure is not 130/80 or less, discuss with your provider to help get this under control.  It is ideal to have an HDL good cholesterol number >50 and have a LDL bad cholesterol number <100.   Don't smoke One simple strategy to help you make good food choices and eat appropriate portions sizes is to divide up your plate. These three divisions on your plate promote healthy eating:  One-half: fruit and nonstarchy vegetables One-quarter: whole grains One-quarter: protein-rich foods, such as legumes, fish or lean meats

## 2023-08-17 LAB — CHLAMYDIA/GONOCOCCUS/TRICHOMONAS, NAA
Chlamydia by NAA: NEGATIVE
Gonococcus by NAA: NEGATIVE
Trich vag by NAA: NEGATIVE

## 2023-08-17 NOTE — Progress Notes (Signed)
Chlamydia and gonorrhea negative.

## 2023-12-12 ENCOUNTER — Other Ambulatory Visit: Payer: BC Managed Care – PPO

## 2023-12-18 ENCOUNTER — Ambulatory Visit: Payer: BC Managed Care – PPO | Admitting: Medical

## 2023-12-18 VITALS — BP 120/80 | HR 70 | Wt 267.2 lb

## 2023-12-18 DIAGNOSIS — Z111 Encounter for screening for respiratory tuberculosis: Secondary | ICD-10-CM | POA: Diagnosis not present

## 2023-12-18 DIAGNOSIS — M79645 Pain in left finger(s): Secondary | ICD-10-CM | POA: Diagnosis not present

## 2023-12-18 DIAGNOSIS — M25642 Stiffness of left hand, not elsewhere classified: Secondary | ICD-10-CM

## 2023-12-18 NOTE — Progress Notes (Signed)
 Subjective:  Andrew Valdez is a 38 y.o. male who presents for Chief Complaint  Patient presents with   Consult    Discuss testosterone- CBC, CMET, A1c, Vitamin D, B12, insulin,iron, ferritin,lipids,testosertone, HSCRP, APOB,  GGT, HOMA-IR,  C-reactive Protein, CAC,  NMR lipo profile, folate, Uric Acid, Thyroid panel - fasting today Needs quanterferon      Here for a few concerns.     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  Wants to do some regular labs tests twice yearly.  He has been doing some reading on different screening test.  There are several test he listed the day that he wants to screen twice a year.  Overall he has no particular symptoms of a concern today other than his finger.  Feels fine otherwise  Hit left hand against wall 5 months ago, and since then left middle finger doesn't bend or extend like normal.  He was reaching for something, hit open hand against wall.    Had some pain then and for a week or so, hurt left middle finger.   However, since then has persistent issues with finger not wanting to straight.  Does hurt some.  He is right-handed.  No other aggravating or relieving factors.    No other c/o.  Past Medical History:  Diagnosis Date   Obesity    Family History  Problem Relation Age of Onset   Cancer Mother        breast   Hypertension Father    Diabetes Neg Hx    Heart disease Neg Hx    Stroke Neg Hx      The following portions of the patient's history were reviewed and updated as appropriate: allergies, current medications, past family history, past medical history, past social history, past surgical history and problem list.  ROS Otherwise as in subjective above    Objective: BP 120/80   Pulse 70   Wt 267 lb 3.2 oz (121.2 kg)   BMI 37.27 kg/m   BP Readings from Last 3 Encounters:  12/18/23 120/80  08/15/23 126/80  06/07/18 128/80    General appearance:  alert, no distress, well developed, well nourished Left middle finger tender at the PIP, mild pain at the PIP with finger extension which is slightly reduced, otherwise range of motion normal, rest of hand without obvious abnormality Hands neurovascularly intact    Assessment: Encounter Diagnoses  Name Primary?   Finger pain, left Yes   Decreased range of motion of finger of left hand    Screening for tuberculosis      Plan: Left finger pain, decreased range of motion of left finger, middle finger Go for baseline x-ray We discussed the possibility of tendon injury, sprain strain injury, or avulsion fracture from 4 months ago. If x-ray normal can refer to occupational therapy  Screening for tuberculosis today, labs as below  We discussed his other concerns today regarding lab testing.  He had a whole list of labs.  We discussed each lab, pros and cons.  He just had a physical in October 2024.  We reviewed those labs which included elevated LDL and at risk for diabetes with slightly elevated hemoglobin A1c.  All of his other lab test including STD screening on that day was normal  I advised that we can either today or at his next physical do some of the other blood work such as baseline screening for testosterone, folate, iron,  B12, vitamin D and such.  Next physical in October 2025 we can do NMR LipoProfile  In general I do recommend eating 3-4 fruit servings daily, vegetables every meal every day, lean cuts of meat such as 4 to 5 ounces or less+  Counseled on diet and exercise  Ej was seen today for consult.  Diagnoses and all orders for this visit:  Finger pain, left -     DG Finger Middle Left  Decreased range of motion of finger of left hand -     DG Finger Middle Left  Screening for tuberculosis -     QuantiFERON-TB Gold Plus    Follow up: pending lab, xray

## 2023-12-18 NOTE — Patient Instructions (Signed)
 Please go to [facility] for your left middle finger xray.   Their hours are 8am - 4:30 pm Monday - Friday.  Take your insurance card with you.  [facility] [phone]   [address] [place], [place] [address]

## 2023-12-21 LAB — QUANTIFERON-TB GOLD PLUS
QuantiFERON Nil Value: 0.02 [IU]/mL
QuantiFERON TB1 Ag Value: 0.02 [IU]/mL
QuantiFERON TB2 Ag Value: 0.01 [IU]/mL

## 2023-12-21 NOTE — Progress Notes (Signed)
 QuantiFERON gold tuberculosis screen negative

## 2023-12-28 ENCOUNTER — Ambulatory Visit
Admission: RE | Admit: 2023-12-28 | Discharge: 2023-12-28 | Disposition: A | Discharge status: Home / Self Care | Source: Ambulatory Visit | Attending: Medical | Admitting: Medical

## 2024-01-02 ENCOUNTER — Other Ambulatory Visit: Payer: Self-pay | Admitting: Medical

## 2024-01-02 DIAGNOSIS — M25649 Stiffness of unspecified hand, not elsewhere classified: Secondary | ICD-10-CM

## 2024-01-02 DIAGNOSIS — S6990XD Unspecified injury of unspecified wrist, hand and finger(s), subsequent encounter: Secondary | ICD-10-CM

## 2024-01-02 NOTE — Progress Notes (Signed)
 No obvious fracture or bony abnormality of your finger.  If agreeable I would like to refer you to occupational therapy to help strengthen the finger and get range of motion back to normal

## 2024-01-08 ENCOUNTER — Ambulatory Visit: Attending: Medical | Admitting: Occupational Therapy

## 2024-01-08 DIAGNOSIS — S6990XD Unspecified injury of unspecified wrist, hand and finger(s), subsequent encounter: Secondary | ICD-10-CM | POA: Diagnosis not present

## 2024-01-08 DIAGNOSIS — M25542 Pain in joints of left hand: Secondary | ICD-10-CM | POA: Insufficient documentation

## 2024-01-08 DIAGNOSIS — R29898 Other symptoms and signs involving the musculoskeletal system: Secondary | ICD-10-CM | POA: Diagnosis present

## 2024-01-08 DIAGNOSIS — M25642 Stiffness of left hand, not elsewhere classified: Secondary | ICD-10-CM | POA: Insufficient documentation

## 2024-01-08 DIAGNOSIS — M25649 Stiffness of unspecified hand, not elsewhere classified: Secondary | ICD-10-CM | POA: Diagnosis not present

## 2024-01-08 NOTE — Therapy (Unsigned)
 OUTPATIENT OCCUPATIONAL THERAPY ORTHO EVALUATION  Patient Name: Andrew Valdez MRN: [id number] DOB:25-Mar-1986, 38 y.o., male Today's Date: 01/08/2024  PCP: Jac Canavan, PA-C REFERRING PROVIDER: Jac Canavan, PA-C  END OF SESSION:  OT End of Session - 01/08/24 0943     Visit Number 1    Number of Visits 5    Date for OT Re-Evaluation 02/16/24    Authorization Type [employer]BS [id number]    Authorization Time Period VL: 30 PT/OT No Auth Required    OT Start Time [phone]    OT Stop Time 1020    OT Time Calculation (min) 38 min    Equipment Utilized During Treatment Testing Material, fluidotherapy    Activity Tolerance Patient tolerated treatment well    Behavior During Therapy WFL for tasks assessed/performed             Past Medical History:  Diagnosis Date   Obesity    Past Surgical History:  Procedure Laterality Date   INCISION AND DRAINAGE PERIRECTAL ABSCESS  2020   Patient Active Problem List   Diagnosis Date Noted   Encounter for health maintenance examination in adult 08/15/2023   Screen for STD (sexually transmitted disease) 08/15/2023   Screening for lipid disorders 08/15/2023   Screening for diabetes mellitus 08/15/2023    ONSET DATE: 01/02/2024 (~08/2023)  REFERRING DIAG: S69.90XD (ICD-10-CM) - Finger injury, unspecified laterality, subsequent encounter M25.649 (ICD-10-CM) - Decreased range of motion of finger, unspecified laterality  THERAPY DIAG:  Stiffness of left hand, not elsewhere classified  Pain in joint of left hand  Other symptoms and signs involving the musculoskeletal system  Rationale for Evaluation and Treatment: {HABREHAB:27488}  SUBJECTIVE:   SUBJECTIVE STATEMENT: *** Pt accompanied by: self  PERTINENT HISTORY: ***  PRECAUTIONS: {Therapy precautions:24002}  RED FLAGS: {PT Red Flags:29287}   WEIGHT BEARING RESTRICTIONS: {Yes ***/No:24003}  PAIN:  Are you having pain? No not at rest, but if he presses on the middle finger  and PIP joint   FALLS: Has patient fallen in last 6 months? No  PLOF: Independent, works in group home  PATIENT GOALS: move the finger better  NEXT MD VISIT: NA  OBJECTIVE:  Note: Objective measures were completed at Evaluation unless otherwise noted.  HAND DOMINANCE: Right  ADLs: WFL  FUNCTIONAL OUTCOME MEASURES: {OTFUNCTIONALMEASURES:27238}  UPPER EXTREMITY ROM:      WFL, full composite finger flexion/fist  UPPER EXTREMITY MMT:     MMT Right eval Left eval  Shoulder flexion    Shoulder abduction    Shoulder adduction    Shoulder extension    Shoulder internal rotation    Shoulder external rotation    Middle trapezius    Lower trapezius    Elbow flexion    Elbow extension    Wrist flexion    Wrist extension    Wrist ulnar deviation    Wrist radial deviation    Wrist pronation    Wrist supination    (Blank rows = not tested)  HAND FUNCTION: Grip strength: Right: 109.5, 100.3, 101.4 lbs; Left: 102.9, 108.4, 100.3 lbs  COORDINATION: WFL  SENSATION: WFL  EDEMA: NA - did have some swelling initially  COGNITION: Overall cognitive status: Within functional limits for tasks assessed Areas of impairment: NA  OBSERVATIONS: Pt ambulates with no AE and no loss of balance. The pt appears is well kept and has no observable limitations with LUE strength or flexion in his hand.  TREATMENT DATE: 01/08/24  TE: Pt placed LUE in Fluidotherapy machine with supervised ROM x 10 min. Pt was educated to complete tendon gliding exercises during modality time to improve ROM and decrease pain/stiffness of affected extremity by use of the machine's massaging action and thermal properties.   Self Care: Patient education provided re: application of heat intermittently (avoid sleeping on heating pad) through options provided in handout.   PATIENT  EDUCATION: Education details: OT role, POC considerations, tendon glide, heat program, ROM pgm Person educated: Patient Education method: Explanation, Demonstration, Verbal cues, and Handouts Education comprehension: verbalized understanding, returned demonstration, verbal cues required, and needs further education   HOME EXERCISE PROGRAM: 01/08/24: Tendon Glide Exercises and Heat HEP ideas  GOALS: Goals reviewed with patient? Yes  LONG TERM GOALS: Target date: 02/16/24  Patient will demonstrate updated L UE HEP with visual handouts only for proper execution. Baseline: New to outpt OT Goal status: INITIAL  2.  Patient will demonstrate independent management of home based pain management techniques for pain <3/10. Baseline: 4-7/10 with stretch of L long finger Goal status: INITIAL  3.  Patient will be able to weight bear through extended LUE fingers/hand with pain <3/10 Baseline: 4-7/10 with stretch of L long finger Goal status: INITIAL  ASSESSMENT:  CLINICAL IMPRESSION: Patient is a 38 y.o. male who was seen today for occupational therapy evaluation for L long finger stiffness and pain. Hx includes bumping left hand/long extended finger against wall 5+ months ago, and since then left middle finger doesn't bend or extend like normal. X-ray negative for fracture or bony abnormalities.  Patient currently presents below baseline level of function with LUE demonstrating difficulties with fully extending L long finger with discomfort in PIP joint with end range of extensor motion. Pt would benefit from skilled OT services in the outpatient setting to work on impairments as noted below to help pt return to PLOF as able.     PERFORMANCE DEFICITS: in functional skills including pain and fascial restrictions  IMPAIRMENTS: are limiting patient from rest and sleep and pt may bump hand in sleep and it wakes him .   COMORBIDITIES: has no other co-morbidities that affects occupational  performance. Patient will benefit from skilled OT to address above impairments and improve overall function.  MODIFICATION OR ASSISTANCE TO COMPLETE EVALUATION: No modification of tasks or assist necessary to complete an evaluation.  OT OCCUPATIONAL PROFILE AND HISTORY: Problem focused assessment: Including review of records relating to presenting problem.  CLINICAL DECISION MAKING: LOW - limited treatment options, no task modification necessary  REHAB POTENTIAL: Excellent  EVALUATION COMPLEXITY: Low      PLAN:  OT FREQUENCY: 1x/week  OT DURATION: 4 weeks + eval/tx - 5 visits  PLANNED INTERVENTIONS: 97535 self care/ADL training, 16109 therapeutic exercise, 97530 therapeutic activity, 97140 manual therapy, 97035 ultrasound, 97018 paraffin, 60454 fluidotherapy, passive range of motion, coping strategies training, patient/family education, and DME and/or AE instructions  RECOMMENDED OTHER SERVICES: NA  CONSULTED AND AGREED WITH PLAN OF CARE: Patient  PLAN FOR NEXT SESSION: Modalities - fluido/US ROM pgm Putty HEP   Victorino Sparrow, OT 01/08/2024, 12:10 PM

## 2024-01-08 NOTE — Patient Instructions (Signed)

## 2024-08-21 ENCOUNTER — Encounter: Payer: BC Managed Care – PPO | Admitting: Medical

## 2024-08-27 ENCOUNTER — Ambulatory Visit: Payer: Self-pay | Admitting: Medical

## 2024-08-27 VITALS — BP 120/82 | HR 77 | Ht 69.25 in | Wt 259.8 lb

## 2024-08-27 DIAGNOSIS — Z2820 Immunization not carried out because of patient decision for unspecified reason: Secondary | ICD-10-CM

## 2024-08-27 DIAGNOSIS — R7301 Impaired fasting glucose: Secondary | ICD-10-CM | POA: Insufficient documentation

## 2024-08-27 DIAGNOSIS — Z0000 Encounter for general adult medical examination without abnormal findings: Secondary | ICD-10-CM

## 2024-08-27 DIAGNOSIS — Z6838 Body mass index (BMI) 38.0-38.9, adult: Secondary | ICD-10-CM | POA: Insufficient documentation

## 2024-08-27 DIAGNOSIS — Z131 Encounter for screening for diabetes mellitus: Secondary | ICD-10-CM | POA: Diagnosis not present

## 2024-08-27 DIAGNOSIS — Z13220 Encounter for screening for lipoid disorders: Secondary | ICD-10-CM

## 2024-08-27 LAB — COMPREHENSIVE METABOLIC PANEL WITH GFR
ALT: 9 IU/L (ref 0–44)
AST: 14 IU/L (ref 0–40)
Albumin: 4.5 g/dL (ref 4.1–5.1)
Alkaline Phosphatase: 74 IU/L (ref 47–123)
BUN/Creatinine Ratio: 16 (ref 9–20)
BUN: 18 mg/dL (ref 6–20)
Bilirubin Total: 0.3 mg/dL (ref 0.0–1.2)
CO2: 22 mmol/L (ref 20–29)
Calcium: 9.8 mg/dL (ref 8.7–10.2)
Chloride: 104 mmol/L (ref 96–106)
Creatinine, Ser: 1.15 mg/dL (ref 0.76–1.27)
Globulin, Total: 3.2 g/dL (ref 1.5–4.5)
Glucose: 101 mg/dL — ABNORMAL HIGH (ref 70–99)
Potassium: 4.3 mmol/L (ref 3.5–5.2)
Sodium: 141 mmol/L (ref 134–144)
Total Protein: 7.7 g/dL (ref 6.0–8.5)
eGFR: 84 mL/min/1.73 (ref >59)

## 2024-08-27 LAB — CBC
Hematocrit: 50.1 % (ref 37.5–51.0)
Hemoglobin: 15.8 g/dL (ref 13.0–17.7)
MCH: 27.5 pg (ref 26.6–33.0)
MCHC: 31.5 g/dL (ref 31.5–35.7)
MCV: 87 fL (ref 79–97)
Platelets: 265 x10E3/uL (ref 150–450)
RBC: 5.74 x10E6/uL (ref 4.14–5.80)
RDW: 13 % (ref 11.6–15.4)
WBC: 3.9 x10E3/uL (ref 3.4–10.8)

## 2024-08-27 LAB — HEMOGLOBIN A1C
Est. average glucose Bld gHb Est-mCnc: 114 mg/dL
Hgb A1c MFr Bld: 5.6 % (ref 4.8–5.6)

## 2024-08-27 LAB — LIPID PANEL
Chol/HDL Ratio: 4.1 ratio (ref 0.0–5.0)
Cholesterol, Total: 194 mg/dL (ref 100–199)
HDL: 47 mg/dL (ref >39)
LDL Chol Calc (NIH): 134 mg/dL — ABNORMAL HIGH (ref 0–99)
Triglycerides: 68 mg/dL (ref 0–149)
VLDL Cholesterol Cal: 13 mg/dL (ref 5–40)

## 2024-08-27 LAB — TSH: TSH: 2.42 u[IU]/mL (ref 0.450–4.500)

## 2024-08-27 NOTE — Progress Notes (Signed)
 Subjective:   HPI  Andrew Valdez is a 38 y.o. male who presents for Chief Complaint  Patient presents with   Annual Exam    Fasting cpe, declines flu and covid today    Patient Care Team: Andrew Valdez, Andrew Valdez as PCP - General (Family Medicine) Dentist Eye doctor   Concerns: Andrew Valdez is a 38 year old male who presents for an annual physical exam.  He has not undergone any new surgeries since his previous incision and drainage procedure. No new allergies or medications have been reported.  His family history is significant for his mother having breast cancer and high blood pressure.  He denies smoking, drug use, and alcohol consumption. He is currently employed at [employer] and engages in very little exercise.  Previous blood work from a year ago showed slightly elevated cholesterol and a borderline diabetes marker, while blood count, liver, kidney, and electrolyte levels were normal.   Reviewed their medical, surgical, family, social, medication, and allergy history and updated chart as appropriate.  No Known Allergies  Past Medical History:  Diagnosis Date   Obesity     No current outpatient medications on file prior to visit.   No current facility-administered medications on file prior to visit.     No current outpatient medications on file.  Family History  Problem Relation Age of Onset   Cancer Mother        breast   Hypertension Father    Diabetes Neg Hx    Heart disease Neg Hx    Stroke Neg Hx     Past Surgical History:  Procedure Laterality Date   INCISION AND DRAINAGE PERIRECTAL ABSCESS  2020  Review of Systems  Constitutional:  Negative for chills, fever, malaise/fatigue and weight loss.  HENT:  Negative for congestion, ear pain, hearing loss, sore throat and tinnitus.   Eyes:  Negative for blurred vision, pain and redness.  Respiratory:  Negative for cough, hemoptysis and shortness of breath.   Cardiovascular:  Negative for chest  pain, palpitations, orthopnea, claudication and leg swelling.  Gastrointestinal:  Negative for abdominal pain, blood in stool, constipation, diarrhea, nausea and vomiting.  Genitourinary:  Negative for dysuria, flank pain, frequency, hematuria and urgency.  Musculoskeletal:  Negative for falls, joint pain and myalgias.  Skin:  Negative for itching and rash.  Neurological:  Negative for dizziness, tingling, speech change, weakness and headaches.  Endo/Heme/Allergies:  Negative for polydipsia. Does not bruise/bleed easily.  Psychiatric/Behavioral:  Negative for depression and memory loss. The patient is not nervous/anxious and does not have insomnia.       Objective:  BP 120/82   Pulse 77   Ht 5' 9.25 (1.759 m)   Wt 259 lb 12.8 oz (117.8 kg)   SpO2 97%   BMI 38.09 kg/m   General appearance: alert, no distress, WD/WN, African American male Skin: unremarkable HEENT: normocephalic, conjunctiva/corneas normal, sclerae anicteric, PERRLA, EOMi, nares patent, no discharge or erythema, pharynx normal Oral cavity: MMM, tongue normal, teeth normal Neck: supple, no lymphadenopathy, no thyromegaly, no masses, normal ROM, no bruits Chest: non tender, normal shape and expansion Heart: RRR, normal S1, S2, no murmurs Lungs: CTA bilaterally, no wheezes, rhonchi, or rales Abdomen: +bs, soft, non tender, non distended, no masses, no hepatomegaly, no splenomegaly, no bruits Back: non tender, normal ROM, no scoliosis Musculoskeletal: upper extremities non tender, no obvious deformity, normal ROM throughout, lower extremities non tender, no obvious deformity, normal ROM throughout Extremities: no edema, no cyanosis, no  clubbing Pulses: 2+ symmetric, upper and lower extremities, normal cap refill Neurological: alert, oriented x 3, CN2-12 intact, strength normal upper extremities and lower extremities, sensation normal throughout, DTRs 2+ throughout, no cerebellar signs, gait normal Psychiatric: normal  affect, behavior normal, pleasant  GU: normal male external genitalia,circumcised, nontender, no masses, no hernia, no lymphadenopathy Rectal: deferred    Assessment and Plan :   Encounter Diagnoses  Name Primary?   Encounter for health maintenance examination in adult Yes   Vaccine refused by patient    Screening for lipid disorders    Screening for diabetes mellitus    Impaired fasting blood sugar     This visit was a preventative care visit, also known as wellness visit or routine physical.   Topics typically include healthy lifestyle, diet, exercise, preventative care, vaccinations, sick and well care, proper use of emergency dept and after hours care, as well as other concerns.     Separate significant issues discussed: Impaired glucose last year-counseled on diet and exercise  BMI 38-checking regularly, losing almost 10 pounds from last year.  Continue efforts to lose more weight through healthy diet and exercise   General Recommendations: Continue to return yearly for your annual wellness and preventative care visits.  This gives us  a chance to discuss healthy lifestyle, exercise, vaccinations, review your chart record, and perform screenings where appropriate.  I recommend you see your eye doctor yearly for routine vision care.  I recommend you see your dentist yearly for routine dental care including hygiene visits twice yearly.   Vaccination  Immunization History  Administered Date(s) Administered   Tdap 08/15/2023    Vaccine recommendations: Flu   Screening for cancer: Colon cancer screening: Age 52  Testicular cancer screening You should do a monthly self testicular exam if you are between 19-40 years old, and we typically do a testicular exam on the yearly physical for this same age group.   Prostate Cancer screening: The recommended prostate cancer screening test is a blood test called the prostate-specific antigen (PSA) test. PSA is a protein that  is made in the prostate. As you age, your prostate naturally produces more PSA. Abnormally high PSA levels may be caused by: Prostate cancer. An enlarged prostate that is not caused by cancer (benign prostatic hyperplasia, or BPH). This condition is very common in older men. A prostate gland infection (prostatitis) or urinary tract infection. Certain medicines such as male hormones (like testosterone) or other medicines that raise testosterone levels. A rectal exam may be done as part of prostate cancer screening to help provide information about the size of your prostate gland. When a rectal exam is performed, it should be done after the PSA level is drawn to avoid any effect on the results.   Skin cancer screening: Check your skin regularly for new changes, growing lesions, or other lesions of concern Come in for evaluation if you have skin lesions of concern.   Lung cancer screening: If you have a greater than 20 pack year history of tobacco use, then you may qualify for lung cancer screening with a chest CT scan.   Please call your insurance company to inquire about coverage for this test.   Pancreatic cancer:  no current screening test is available or routinely recommended. (risk factors: smoking, overweight or obese, diabetes, chronic pancreatitis, work exposure - dry cleaning, metal working, 38yo>, M>F, Tree Surgeon, family hx/o, hereditary breast, ovarian, melanoma, lynch, peutz-jeghers).  Symptoms: jaundice, dark urine, light color or greasy stools, itchy  skin, belly or back pain, weight loss, poor appetite, nausea, vomiting, liver enlargement, DVT/blood clots.   We currently don't have screenings for other cancers besides breast, cervical, colon, and lung cancers.  If you have a strong family history of cancer or have other cancer screening concerns, please let me know.  Genetic testing referral is an option for individuals with high cancer risk in the family.  There are some other  cancer screenings in development currently.   Bone health: Get at least 150 minutes of aerobic exercise weekly Get weight bearing exercise at least once weekly Bone density test:  A bone density test is an imaging test that uses a type of X-ray to measure the amount of calcium and other minerals in your bones. The test may be used to diagnose or screen you for a condition that causes weak or thin bones (osteoporosis), predict your risk for a broken bone (fracture), or determine how well your osteoporosis treatment is working. The bone density test is recommended for females 65 and older, or females or males <65 if certain risk factors such as thyroid disease, long term use of steroids such as for asthma or rheumatological issues, vitamin D deficiency, estrogen deficiency, family history of osteoporosis, self or family history of fragility fracture in first degree relative.    Heart health: Get at least 150 minutes of aerobic exercise weekly Limit alcohol It is important to maintain a healthy blood pressure and healthy cholesterol numbers  Heart disease screening: Screening for heart disease includes screening for blood pressure, fasting lipids, glucose/diabetes screening, BMI height to weight ratio, reviewed of smoking status, physical activity, and diet.    Goals include blood pressure 120/80 or less, maintaining a healthy lipid/cholesterol profile, preventing diabetes or keeping diabetes numbers under good control, not smoking or using tobacco products, exercising most days per week or at least 150 minutes per week of exercise, and eating healthy variety of fruits and vegetables, healthy oils, and avoiding unhealthy food choices like fried food, fast food, high sugar and high cholesterol foods.    Other tests may possibly include EKG test, CT coronary calcium score, echocardiogram, exercise treadmill stress test.      Vascular disease screening: For higher risk individuals including  smokers, diabetics, patients with known heart disease or high blood pressure, kidney disease, and others, screening for vascular disease or atherosclerosis of the arteries is available.  Examples may include carotid ultrasound, abdominal aortic ultrasound, ABI blood flow screening in the legs, thoracic aorta screening.   Medical care options: I recommend you continue to seek care here first for routine care.  We try really hard to have available appointments Monday through Friday daytime hours for sick visits, acute visits, and physicals.  Urgent care should be used for after hours and weekends for significant issues that cannot wait till the next day.  The emergency department should be used for significant potentially life-threatening emergencies.  The emergency department is expensive, can often have long wait times for less significant concerns, so try to utilize primary care, urgent care, or telemedicine when possible to avoid unnecessary trips to the emergency department.  Virtual visits and telemedicine have been introduced since the pandemic started in 2020, and can be convenient ways to receive medical care.  We offer virtual appointments as well to assist you in a variety of options to seek medical care.   Legal  Take the time to do a last will and testament, Advanced Directives including Health Care Power of  Attorney and Living Will documents.  Don't leave your family with burdens that can be handled ahead of time.   Advanced Directives: I recommend you consider completing a Health Care Power of Attorney and Living Will.   These documents respect your wishes and help alleviate burdens on your loved ones if you were to become terminally ill or be in a position to need those documents enforced.    You can complete Advanced Directives yourself, have them notarized, then have copies made for our office, for you and for anybody you feel should have them in safe keeping.  Or, you can have an  attorney prepare these documents.   If you haven't updated your Last Will and Testament in a while, it may be worthwhile having an attorney prepare these documents together and save on some costs.       Spiritual and Emotional Health Keeping a healthy spiritual life can help you better manage your physical health. Your spiritual life can help you to cope with any issues that may arise with your physical health.  Balance can keep us  healthy and help us  to recover.  If you are struggling with your spiritual health there are questions that you may want to ask yourself:  What makes me feel most complete? When do I feel most connected to the rest of the world? Where do I find the most inner strength? What am I doing when I feel whole?  Helpful tips: Being in nature. Some people feel very connected and at peace when they are walking outdoors or are outside. Helping others. Some feel the largest sense of wellbeing when they are of service to others. Being of service can take on many forms. It can be doing volunteer work, being kind to strangers, or offering a hand to a friend in need. Gratitude. Some people find they feel the most connected when they remain grateful. They may make lists of all the things they are grateful for or say a thank you out loud for all they have.    Emotional Health Are you in tune with your emotional health?  Check out this link: Http://www.marquez-love.com/    Financial Health Make sure you use a budget for your personal finances Make sure you are insured against risks (health insurance, life insurance, auto insurance, etc) Save more, spend less Set financial goals If you need help in this area, good resources include counseling through [employer] or other community resources, have a meeting with a social research officer, government, and a good resource is the [employer] was seen today for annual exam.  Diagnoses and all orders for  this visit:  Encounter for health maintenance examination in adult -     CBC -     Comprehensive metabolic panel with GFR -     Lipid panel -     TSH -     Hemoglobin A1c  Vaccine refused by patient  Screening for lipid disorders -     Lipid panel  Screening for diabetes mellitus -     Hemoglobin A1c  Impaired fasting blood sugar -     Hemoglobin A1c    Follow-up pending labs, yearly for physical

## 2024-08-28 ENCOUNTER — Ambulatory Visit: Payer: Self-pay | Admitting: Medical

## 2024-08-28 NOTE — Progress Notes (Signed)
 Labs show normal liver, kidney and electrolytes.   Blood counts normal.  Thyroid normal.  Diabetes marker normal.  Blood sugar elevated and cholesterol elevated.  This increases risk of prediabetes and the cholesterol increases risk of heart disease.  The cholesterol was a little higher than last year as well.  I recommend continue to eat healthy, and exercise 4 to 5 days/week, work on losing additional weight  Certainly be mindful of cholesterol rich foods to avoid.  There is a pharmaceutical study for weight loss if you would be interested in this which could offer nutrition counseling, medication to lose weight, if repeat labs and $stipend to attend the study.  Let me know if interested in this.  Recommendations for improving lipids:  Foods TO AVOID or limit - fried foods, high sugar foods, white bread, enriched flour, fast food, red meat, large amounts of cheese, processed foods such as little debbie cakes, cookies, pies, donuts, for example  Foods TO INCLUDE in the diet - whole grains such as whole grain pasta, whole grain bread, barley, steel cut oatmeal (not instant oatmeal), avocado, fish, green leafy vegetables, nuts, increased fiber in diet, and using olive oil in small amounts for cooking or as salad dressing vinaigrette.

## 2025-09-02 ENCOUNTER — Encounter: Admitting: Medical
